# Patient Record
Sex: Male | Born: 1945 | Race: White | Hispanic: No | Smoking: Never smoker
Health system: Southern US, Community
[De-identification: ages and names within clinical notes are randomized; demographics above are authoritative.]

## PROBLEM LIST (undated history)

## (undated) DIAGNOSIS — G8929 Other chronic pain: Secondary | ICD-10-CM

## (undated) DIAGNOSIS — K219 Gastro-esophageal reflux disease without esophagitis: Secondary | ICD-10-CM

## (undated) DIAGNOSIS — M1990 Unspecified osteoarthritis, unspecified site: Secondary | ICD-10-CM

## (undated) DIAGNOSIS — R0600 Dyspnea, unspecified: Secondary | ICD-10-CM

## (undated) DIAGNOSIS — E785 Hyperlipidemia, unspecified: Secondary | ICD-10-CM

## (undated) DIAGNOSIS — M549 Dorsalgia, unspecified: Secondary | ICD-10-CM

## (undated) DIAGNOSIS — J45909 Unspecified asthma, uncomplicated: Secondary | ICD-10-CM

## (undated) DIAGNOSIS — J189 Pneumonia, unspecified organism: Secondary | ICD-10-CM

## (undated) HISTORY — PX: CHOLECYSTECTOMY: SHX55

## (undated) HISTORY — PX: APPENDECTOMY: SHX54

---

## 2013-09-24 DIAGNOSIS — G8929 Other chronic pain: Secondary | ICD-10-CM

## 2013-09-24 DIAGNOSIS — M549 Dorsalgia, unspecified: Secondary | ICD-10-CM

## 2013-09-24 HISTORY — DX: Dorsalgia, unspecified: M54.9

## 2013-09-24 HISTORY — DX: Other chronic pain: G89.29

## 2018-06-16 ENCOUNTER — Encounter (HOSPITAL_COMMUNITY): Payer: Self-pay | Admitting: *Deleted

## 2018-06-16 ENCOUNTER — Other Ambulatory Visit: Payer: Self-pay

## 2018-06-16 ENCOUNTER — Inpatient Hospital Stay (HOSPITAL_COMMUNITY)
Admission: AD | Admit: 2018-06-16 | Discharge: 2018-06-20 | DRG: 871 | Disposition: A | Discharge status: Home Health | Payer: Medicare Other | Source: Other Acute Inpatient Hospital | Attending: Family Medicine | Admitting: Family Medicine

## 2018-06-16 DIAGNOSIS — G8929 Other chronic pain: Secondary | ICD-10-CM | POA: Diagnosis present

## 2018-06-16 DIAGNOSIS — R066 Hiccough: Secondary | ICD-10-CM | POA: Diagnosis present

## 2018-06-16 DIAGNOSIS — A481 Legionnaires' disease: Secondary | ICD-10-CM | POA: Diagnosis present

## 2018-06-16 DIAGNOSIS — Z23 Encounter for immunization: Secondary | ICD-10-CM | POA: Diagnosis present

## 2018-06-16 DIAGNOSIS — Z9049 Acquired absence of other specified parts of digestive tract: Secondary | ICD-10-CM | POA: Diagnosis not present

## 2018-06-16 DIAGNOSIS — Z8249 Family history of ischemic heart disease and other diseases of the circulatory system: Secondary | ICD-10-CM | POA: Diagnosis not present

## 2018-06-16 DIAGNOSIS — E860 Dehydration: Secondary | ICD-10-CM

## 2018-06-16 DIAGNOSIS — M549 Dorsalgia, unspecified: Secondary | ICD-10-CM | POA: Diagnosis present

## 2018-06-16 DIAGNOSIS — E876 Hypokalemia: Secondary | ICD-10-CM | POA: Diagnosis present

## 2018-06-16 DIAGNOSIS — K219 Gastro-esophageal reflux disease without esophagitis: Secondary | ICD-10-CM | POA: Diagnosis present

## 2018-06-16 DIAGNOSIS — N179 Acute kidney failure, unspecified: Secondary | ICD-10-CM | POA: Diagnosis present

## 2018-06-16 DIAGNOSIS — J45909 Unspecified asthma, uncomplicated: Secondary | ICD-10-CM | POA: Diagnosis present

## 2018-06-16 DIAGNOSIS — A419 Sepsis, unspecified organism: Principal | ICD-10-CM | POA: Diagnosis present

## 2018-06-16 DIAGNOSIS — Z8701 Personal history of pneumonia (recurrent): Secondary | ICD-10-CM

## 2018-06-16 DIAGNOSIS — R197 Diarrhea, unspecified: Secondary | ICD-10-CM | POA: Diagnosis present

## 2018-06-16 DIAGNOSIS — R112 Nausea with vomiting, unspecified: Secondary | ICD-10-CM | POA: Diagnosis present

## 2018-06-16 DIAGNOSIS — Z833 Family history of diabetes mellitus: Secondary | ICD-10-CM

## 2018-06-16 DIAGNOSIS — E785 Hyperlipidemia, unspecified: Secondary | ICD-10-CM | POA: Diagnosis present

## 2018-06-16 DIAGNOSIS — Z881 Allergy status to other antibiotic agents status: Secondary | ICD-10-CM

## 2018-06-16 DIAGNOSIS — J189 Pneumonia, unspecified organism: Secondary | ICD-10-CM | POA: Diagnosis not present

## 2018-06-16 HISTORY — DX: Pneumonia, unspecified organism: J18.9

## 2018-06-16 HISTORY — DX: Hyperlipidemia, unspecified: E78.5

## 2018-06-16 HISTORY — DX: Gastro-esophageal reflux disease without esophagitis: K21.9

## 2018-06-16 HISTORY — DX: Unspecified asthma, uncomplicated: J45.909

## 2018-06-16 HISTORY — DX: Dyspnea, unspecified: R06.00

## 2018-06-16 HISTORY — DX: Dorsalgia, unspecified: M54.9

## 2018-06-16 HISTORY — DX: Other chronic pain: G89.29

## 2018-06-16 LAB — CBC
HCT: 35.5 % — ABNORMAL LOW (ref 39.0–52.0)
Hemoglobin: 11.7 g/dL — ABNORMAL LOW (ref 13.0–17.0)
MCH: 30.1 pg (ref 26.0–34.0)
MCHC: 33 g/dL (ref 30.0–36.0)
MCV: 91.3 fL (ref 78.0–100.0)
PLATELETS: 193 10*3/uL (ref 150–400)
RBC: 3.89 MIL/uL — ABNORMAL LOW (ref 4.22–5.81)
RDW: 13.6 % (ref 11.5–15.5)
WBC: 14.1 10*3/uL — ABNORMAL HIGH (ref 4.0–10.5)

## 2018-06-16 LAB — CREATININE, SERUM
CREATININE: 1.4 mg/dL — AB (ref 0.61–1.24)
GFR calc Af Amer: 56 mL/min — ABNORMAL LOW (ref >60)
GFR calc non Af Amer: 49 mL/min — ABNORMAL LOW (ref >60)

## 2018-06-16 MED ORDER — ENOXAPARIN SODIUM 40 MG/0.4ML ~~LOC~~ SOLN
40.0000 mg | SUBCUTANEOUS | Status: DC
  Administered 2018-06-16 – 2018-06-19 (×4): 40 mg via SUBCUTANEOUS
  Filled 2018-06-16 (×4): qty 0.4

## 2018-06-16 MED ORDER — LEVOFLOXACIN IN D5W 750 MG/150ML IV SOLN
750.0000 mg | INTRAVENOUS | Status: DC
  Administered 2018-06-17 – 2018-06-19 (×3): 750 mg via INTRAVENOUS
  Filled 2018-06-16 (×3): qty 150

## 2018-06-16 MED ORDER — MAGNESIUM HYDROXIDE 400 MG/5ML PO SUSP: 15.0000 mL | Freq: Every day | ORAL | Status: DC | PRN

## 2018-06-16 MED ORDER — SODIUM CHLORIDE 0.9 % IV SOLN
INTRAVENOUS | Status: AC
  Administered 2018-06-16: 19:00:00 via INTRAVENOUS

## 2018-06-16 MED ORDER — ONDANSETRON HCL 4 MG/2ML IJ SOLN
4.0000 mg | Freq: Four times a day (QID) | INTRAMUSCULAR | Status: DC | PRN
  Administered 2018-06-18: 4 mg via INTRAVENOUS
  Filled 2018-06-16: qty 2

## 2018-06-16 MED ORDER — ALUM & MAG HYDROXIDE-SIMETH 200-200-20 MG/5ML PO SUSP: 30.0000 mL | Freq: Four times a day (QID) | ORAL | Status: DC | PRN

## 2018-06-16 MED ORDER — ACETAMINOPHEN 325 MG PO TABS
650.0000 mg | ORAL_TABLET | Freq: Four times a day (QID) | ORAL | Status: DC | PRN
  Administered 2018-06-16 – 2018-06-18 (×4): 650 mg via ORAL
  Filled 2018-06-16 (×4): qty 2

## 2018-06-16 MED ORDER — CHLORPROMAZINE HCL 25 MG PO TABS
25.0000 mg | ORAL_TABLET | Freq: Once | ORAL | Status: AC
  Administered 2018-06-16: 25 mg via ORAL
  Filled 2018-06-16: qty 1

## 2018-06-16 MED ORDER — HYDROCODONE-ACETAMINOPHEN 5-325 MG PO TABS
1.0000 | ORAL_TABLET | Freq: Four times a day (QID) | ORAL | Status: DC | PRN
  Administered 2018-06-17 – 2018-06-20 (×7): 1 via ORAL
  Filled 2018-06-16 (×7): qty 1

## 2018-06-16 NOTE — Progress Notes (Signed)
Patient admitted to floor. AP swing doctor E- paged.

## 2018-06-16 NOTE — H&P (Signed)
History and Physical    [name] ION:[id number] DOB: 1946-06-02 DOA: 06/16/2018  PCP: Donalynn Furlong, MD   Patient coming from: [facility]   I have personally briefly reviewed patient's old medical records in [place]  Chief Complaint: nausea, vomiting  HPI: Evan Price is a 72 y.o. male with medical history significant of asthma, chronic back pain, hyperlipidemia presents with nausea and vomiting.  Patient went to urgent care with a complaint of nausea vomiting and myalgias for 4 days.  Patient was then sent over to the ED at [facility].  Chest x-ray was done which showed a left-sided infiltrate.  Patient was febrile upon presentation.  He also has significant leukocytosis of 16,000.  Flu was negative.  Patient had no recent hospitalizations.  He did have pneumonia a year ago.  Denies any obvious sick contacts.  Has had very little p.o. intake for the past 4 days.  He also had one loose stool a day.  He denies any recent antibiotic usage.  Patient denies any history of kidney disease.  Patient found to have a creatinine of 1.5 with BUN 23 with no prior for comparison.  She does have a history of asthma for which he uses as needed Ventolin.  ED Course: She was given 1 L IV fluid and IV Levaquin, DuoNeb's, antiemetics with some improvement of his symptoms.  Review of Systems: Positive for hiccups, no cough, no chest pain All others reviewed with patient  and are  negative unless otherwise stated   Past Medical History:  Diagnosis Date  . Asthma   . Chronic back pain 2015  . Dyslipidemia   . Dyspnea   . GERD (gastroesophageal reflux disease)   . Pneumonia     Past Surgical History:  Procedure Laterality Date  . APPENDECTOMY       reports that he has never smoked. He has never used smokeless tobacco. He reports that he drinks about 3.0 standard drinks of alcohol per week. He reports that he does not use drugs.  Allergies  Allergen Reactions  . Keflex  [Cephalexin]     Family History  Problem Relation Age of Onset  . Diabetes Mother   . Hypertension Father      Prior to Admission medications   Not on File    Physical Exam: Vitals:   06/16/18 1703  BP: (!) 145/78  Pulse: (!) 116  Resp: 18  Temp: (!) 102.4 F (39.1 C)  TempSrc: Oral  SpO2: 100%  Weight: 75.3 kg  Height: 5\' 11"  (1.803 m)    Constitutional: NAD, calm,illl appearing, warm to touch  Vitals:   06/16/18 1703  BP: (!) 145/78  Pulse: (!) 116  Resp: 18  Temp: (!) 102.4 F (39.1 C)  TempSrc: Oral  SpO2: 100%  Weight: 75.3 kg  Height: 5\' 11"  (1.803 m)   Eyes: PERRL, lids and conjunctivae normal ENMT: Mucous membranes are moist. Posterior pharynx clear of any exudate or lesions.poor dentition.  Neck: normal, supple, no masses,  Respiratory: Fair air movement throughout , no wheezing, no crackles.  Cardiovascular: Tachycardic rate and regular rhythm, no murmurs / rubs / gallops. No extremity edema. 2+ pedal pulses. .  Abdomen: no tenderness, no masses palpated. No hepatosplenomegaly. Bowel sounds positive.  Musculoskeletal: no clubbing / cyanosis. No joint deformity upper and lower extremities.  no contractures. Normal muscle tone.  Skin: no rashes, lesions, ulcers. No induration Neurologic: CN 2-12 grossly intact.  Strength 5/5 in all 4.  Psychiatric:  Normal judgment and insight. Alert and oriented x 3. Normal mood.   Labs on Admission: I have personally reviewed following labs and imaging studies White count 16.9 thousand hemoglobin 14.2 creatinine 1.5 BUN 23 from outside facility  CBC: No results for input(s): WBC, NEUTROABS, HGB, HCT, MCV, PLT in the last 168 hours. Basic Metabolic Panel: No results for input(s): NA, K, CL, CO2, GLUCOSE, BUN, CREATININE, CALCIUM, MG, PHOS in the last 168 hours. GFR: CrCl cannot be calculated (No successful lab value found.). Liver Function Tests: No results for input(s): AST, ALT, ALKPHOS, BILITOT, PROT, ALBUMIN  in the last 168 hours. No results for input(s): LIPASE, AMYLASE in the last 168 hours. No results for input(s): AMMONIA in the last 168 hours. Coagulation Profile: No results for input(s): INR, PROTIME in the last 168 hours. Cardiac Enzymes: No results for input(s): CKTOTAL, CKMB, CKMBINDEX, TROPONINI in the last 168 hours. BNP (last 3 results) No results for input(s): PROBNP in the last 8760 hours. HbA1C: No results for input(s): HGBA1C in the last 72 hours. CBG: No results for input(s): GLUCAP in the last 168 hours. Lipid Profile: No results for input(s): CHOL, HDL, LDLCALC, TRIG, CHOLHDL, LDLDIRECT in the last 72 hours. Thyroid Function Tests: No results for input(s): TSH, T4TOTAL, FREET4, T3FREE, THYROIDAB in the last 72 hours. Anemia Panel: No results for input(s): VITAMINB12, FOLATE, FERRITIN, TIBC, IRON, RETICCTPCT in the last 72 hours. Urine analysis: No results found for: COLORURINE, APPEARANCEUR, LABSPEC, PHURINE, GLUCOSEU, HGBUR, BILIRUBINUR, KETONESUR, PROTEINUR, UROBILINOGEN, NITRITE, LEUKOCYTESUR  Radiological Exams on Admission: No results found.    Assessment/Plan Principal Problem:   PNA (pneumonia) Active Problems:   Dehydration   AKI (acute kidney injury) ([place])   Sepsis (HCC)   Chronic back pain   GERD (gastroesophageal reflux disease)   -Antibiotics per protocol, supportive care, pulmonary toilet.  Follow cultures.  Supplemental oxygen as needed, full liquid diet advance as tolerated, send Legionella and pneumococcal antigens -Continue IV hydration, avoid nephrotoxins ,repeat labs in the a.m. Check CK  -Suspect early sepsis + tachycardia and leukocytosis. BP WNL . -Continue home meds for chronic back pain and GERD   DVT prophylaxis: Lovenox Code Status: Full  disposition Plan: Home 2 days Admission status: Inpatient telemetry It is my clinical opinion that admission to INPATIENT is reasonable and necessary because of the expectation that this  patient will require hospital care that crosses at least 2 midnights to treat this condition based on the medical complexity of the problems presented.    Shelbie Proctor MD [facility] Pager [phone]  If 7PM-7AM, please contact night-coverage [email or web address] Password [email or web address]  06/16/2018, 6:19 PM

## 2018-06-17 LAB — CBC WITH DIFFERENTIAL/PLATELET
Basophils Absolute: 0 10*3/uL (ref 0.0–0.1)
Basophils Relative: 0 %
Eosinophils Absolute: 0 10*3/uL (ref 0.0–0.7)
Eosinophils Relative: 0 %
HEMATOCRIT: 35.4 % — AB (ref 39.0–52.0)
Hemoglobin: 11.6 g/dL — ABNORMAL LOW (ref 13.0–17.0)
LYMPHS ABS: 0.5 10*3/uL — AB (ref 0.7–4.0)
LYMPHS PCT: 5 %
MCH: 29.9 pg (ref 26.0–34.0)
MCHC: 32.8 g/dL (ref 30.0–36.0)
MCV: 91.2 fL (ref 78.0–100.0)
MONO ABS: 0.8 10*3/uL (ref 0.1–1.0)
Monocytes Relative: 8 %
NEUTROS ABS: 9 10*3/uL — AB (ref 1.7–7.7)
Neutrophils Relative %: 87 %
Platelets: 189 10*3/uL (ref 150–400)
RBC: 3.88 MIL/uL — ABNORMAL LOW (ref 4.22–5.81)
RDW: 13.7 % (ref 11.5–15.5)
WBC: 10.3 10*3/uL (ref 4.0–10.5)

## 2018-06-17 LAB — COMPREHENSIVE METABOLIC PANEL
ALBUMIN: 2.6 g/dL — AB (ref 3.5–5.0)
ALK PHOS: 73 U/L (ref 38–126)
ALT: 11 U/L (ref 0–44)
ANION GAP: 10 (ref 5–15)
AST: 20 U/L (ref 15–41)
BUN: 16 mg/dL (ref 8–23)
CALCIUM: 8 mg/dL — AB (ref 8.9–10.3)
CHLORIDE: 103 mmol/L (ref 98–111)
CO2: 22 mmol/L (ref 22–32)
Creatinine, Ser: 1.34 mg/dL — ABNORMAL HIGH (ref 0.61–1.24)
GFR calc Af Amer: 59 mL/min — ABNORMAL LOW (ref >60)
GFR calc non Af Amer: 51 mL/min — ABNORMAL LOW (ref >60)
GLUCOSE: 102 mg/dL — AB (ref 70–99)
POTASSIUM: 3.9 mmol/L (ref 3.5–5.1)
SODIUM: 135 mmol/L (ref 135–145)
Total Bilirubin: 0.9 mg/dL (ref 0.3–1.2)
Total Protein: 6.3 g/dL — ABNORMAL LOW (ref 6.5–8.1)

## 2018-06-17 LAB — LACTIC ACID, PLASMA
LACTIC ACID, VENOUS: 2.1 mmol/L — AB (ref 0.5–1.9)
Lactic Acid, Venous: 1.2 mmol/L (ref 0.5–1.9)

## 2018-06-17 LAB — CK: CK TOTAL: 174 U/L (ref 49–397)

## 2018-06-17 MED ORDER — METOPROLOL TARTRATE 25 MG PO TABS
12.5000 mg | ORAL_TABLET | Freq: Two times a day (BID) | ORAL | Status: DC
  Administered 2018-06-17 – 2018-06-20 (×6): 12.5 mg via ORAL
  Filled 2018-06-17 (×6): qty 1

## 2018-06-17 MED ORDER — LACTATED RINGERS IV BOLUS
1000.0000 mL | Freq: Once | INTRAVENOUS | Status: AC
  Administered 2018-06-17: 1000 mL via INTRAVENOUS

## 2018-06-17 MED ORDER — IPRATROPIUM-ALBUTEROL 0.5-2.5 (3) MG/3ML IN SOLN
3.0000 mL | Freq: Four times a day (QID) | RESPIRATORY_TRACT | Status: DC | PRN
  Administered 2018-06-17 – 2018-06-19 (×3): 3 mL via RESPIRATORY_TRACT
  Filled 2018-06-17 (×3): qty 3

## 2018-06-17 MED ORDER — BACLOFEN 10 MG PO TABS
5.0000 mg | ORAL_TABLET | Freq: Three times a day (TID) | ORAL | Status: AC
  Administered 2018-06-17 (×3): 5 mg via ORAL
  Filled 2018-06-17 (×3): qty 1

## 2018-06-17 MED ORDER — IPRATROPIUM-ALBUTEROL 0.5-2.5 (3) MG/3ML IN SOLN
3.0000 mL | Freq: Four times a day (QID) | RESPIRATORY_TRACT | Status: DC | PRN
  Administered 2018-06-17: 3 mL via RESPIRATORY_TRACT
  Filled 2018-06-17: qty 3

## 2018-06-17 MED ORDER — GABAPENTIN 400 MG PO CAPS
400.0000 mg | ORAL_CAPSULE | Freq: Three times a day (TID) | ORAL | Status: DC
  Administered 2018-06-17 – 2018-06-20 (×11): 400 mg via ORAL
  Filled 2018-06-17 (×11): qty 1

## 2018-06-17 MED ORDER — METOPROLOL TARTRATE 5 MG/5ML IV SOLN
2.5000 mg | Freq: Once | INTRAVENOUS | Status: AC
  Administered 2018-06-17: 2.5 mg via INTRAVENOUS
  Filled 2018-06-17: qty 5

## 2018-06-17 MED ORDER — SODIUM CHLORIDE 0.9 % IV SOLN
INTRAVENOUS | Status: AC
  Administered 2018-06-17 (×2): via INTRAVENOUS

## 2018-06-17 MED ORDER — HYDROCODONE-ACETAMINOPHEN 5-325 MG PO TABS: 1.0000 | ORAL_TABLET | Freq: Four times a day (QID) | ORAL | Status: DC | PRN

## 2018-06-17 NOTE — Progress Notes (Signed)
PROGRESS NOTE    Evan Price  DVV:[id number] DOB: 1945/11/14 DOA: 06/16/2018 PCP: Donalynn Furlong, MD   Brief Narrative:   Evan Price is a 72 y.o. male with medical history significant of asthma, chronic back pain, hyperlipidemia presents with nausea and vomiting.  Patient went to urgent care with a complaint of nausea vomiting and myalgias for 4 days.  Patient was then sent over to the ED at [facility].  Chest x-ray was done which showed a left-sided infiltrate.  Patient was febrile upon presentation.  He also has significant leukocytosis of 16,000.  Flu was negative.  He was noted to have creatinine of 1.5 with BUN 23 with no prior for comparison.  He was admitted with sepsis secondary to pneumonia along with suspected AKI.  He has been started on Levaquin as well as IV hydration.  Assessment & Plan:   Principal Problem:   PNA (pneumonia) Active Problems:   Dehydration   AKI (acute kidney injury) ([place])   Sepsis (HCC)   Chronic back pain   GERD (gastroesophageal reflux disease)   1. Sepsis secondary to community-acquired pneumonia.  Legionella and pneumococcal antigens pending.  Continue on IV Levaquin.  Lactic acid of 1.2 noted.  Continue to follow cultures and he is aggressive IV fluid hydration. 2. AKI versus CKD.  Continue IV fluid hydration and avoid nephrotoxic agents. 3. Chronic back pain.  Continue home medications as needed. 4. GERD.  PPI.   DVT prophylaxis:Lovenox Code Status: Full Family Communication: None at bedside Disposition Plan: Treatment with IV antibiotics and IV fluids with possible discharge in next 24 to 48 hours   Consultants:   None  Procedures:   None  Antimicrobials:   Levaquin 9/23->   Subjective: Patient seen and evaluated today with no new acute complaints or concerns. No acute concerns or events noted overnight.  He continues to have some hiccups and is noted to have tachycardia on telemetry.  Objective: Vitals:   06/17/18 0429 06/17/18 0534 06/17/18 0935 06/17/18 0936  BP:  118/69 127/64   Pulse:  (!) 121 (!) 117 (!) 125  Resp:   20   Temp:  99.1 F (37.3 C) 99.1 F (37.3 C)   TempSrc:  Oral Oral   SpO2: 93% 99% 93% 99%  Weight:      Height:        Intake/Output Summary (Last 24 hours) at 06/17/2018 1139 Last data filed at 06/17/2018 1007 Gross per 24 hour  Intake 839.12 ml  Output -  Net 839.12 ml   Filed Weights   06/16/18 1703  Weight: 75.3 kg    Examination:  General exam: Appears calm and comfortable  Respiratory system: Clear to auscultation. Respiratory effort normal. Cardiovascular system: S1 & S2 heard, RRR. No JVD, murmurs, rubs, gallops or clicks. No pedal edema. Gastrointestinal system: Abdomen is nondistended, soft and nontender. No organomegaly or masses felt. Normal bowel sounds heard. Central nervous system: Alert and oriented. No focal neurological deficits. Extremities: Symmetric 5 x 5 power. Skin: No rashes, lesions or ulcers Psychiatry: Judgement and insight appear normal. Mood & affect appropriate.     Data Reviewed: I have personally reviewed following labs and imaging studies  CBC: Recent Labs  Lab 06/16/18 1923 06/17/18 0437  WBC 14.1* 10.3  NEUTROABS  --  9.0*  HGB 11.7* 11.6*  HCT 35.5* 35.4*  MCV 91.3 91.2  PLT 193 626   Basic Metabolic Panel: Recent Labs  Lab 06/16/18 1923 06/17/18 0437  NA  --  135  K  --  3.9  CL  --  103  CO2  --  22  GLUCOSE  --  102*  BUN  --  16  CREATININE 1.40* 1.34*  CALCIUM  --  8.0*   GFR: Estimated Creatinine Clearance: 53.1 mL/min (A) (by C-G formula based on SCr of 1.34 mg/dL (H)). Liver Function Tests: Recent Labs  Lab 06/17/18 0437  AST 20  ALT 11  ALKPHOS 73  BILITOT 0.9  PROT 6.3*  ALBUMIN 2.6*   No results for input(s): LIPASE, AMYLASE in the last 168 hours. No results for input(s): AMMONIA in the last 168 hours. Coagulation Profile: No results for input(s): INR, PROTIME in the last  168 hours. Cardiac Enzymes: Recent Labs  Lab 06/17/18 0437  CKTOTAL 174   BNP (last 3 results) No results for input(s): PROBNP in the last 8760 hours. HbA1C: No results for input(s): HGBA1C in the last 72 hours. CBG: No results for input(s): GLUCAP in the last 168 hours. Lipid Profile: No results for input(s): CHOL, HDL, LDLCALC, TRIG, CHOLHDL, LDLDIRECT in the last 72 hours. Thyroid Function Tests: No results for input(s): TSH, T4TOTAL, FREET4, T3FREE, THYROIDAB in the last 72 hours. Anemia Panel: No results for input(s): VITAMINB12, FOLATE, FERRITIN, TIBC, IRON, RETICCTPCT in the last 72 hours. Sepsis Labs: Recent Labs  Lab 06/17/18 1032  LATICACIDVEN 1.2    No results found for this or any previous visit (from the past 240 hour(s)).       Radiology Studies: No results found.      Scheduled Meds: . enoxaparin (LOVENOX) injection  40 mg Subcutaneous Q24H   Continuous Infusions: . levofloxacin (LEVAQUIN) IV       LOS: 1 day    Time spent: 30 minutes    Duval Macleod Darleen Crocker, DO [facility] Pager [phone]  If 7PM-7AM, please contact night-coverage [email or web address] Password [facility] 06/17/2018, 11:39 AM

## 2018-06-17 NOTE — Progress Notes (Signed)
Central Telemetry informed this nurse patient having trigemity PVCs, MD notified.

## 2018-06-17 NOTE — Progress Notes (Signed)
Late entry: Patient heart rate in 130's MD notified. Ordered lactated Ringer bolus. Patient heart rate is 91 bpm at this time.

## 2018-06-17 NOTE — Progress Notes (Signed)
CRITICAL VALUE ALERT  Critical Value:  Lactic Acid 2.1  Date & Time Notied:  06/17/18 1425  Provider Notified: Manuella Ghazi  Orders Received/Actions taken:

## 2018-06-18 LAB — BASIC METABOLIC PANEL
ANION GAP: 7 (ref 5–15)
BUN: 12 mg/dL (ref 8–23)
CHLORIDE: 102 mmol/L (ref 98–111)
CO2: 24 mmol/L (ref 22–32)
Calcium: 8.1 mg/dL — ABNORMAL LOW (ref 8.9–10.3)
Creatinine, Ser: 1.05 mg/dL (ref 0.61–1.24)
GFR calc Af Amer: >60 mL/min (ref >60)
GFR calc non Af Amer: >60 mL/min (ref >60)
Glucose, Bld: 102 mg/dL — ABNORMAL HIGH (ref 70–99)
POTASSIUM: 3.6 mmol/L (ref 3.5–5.1)
Sodium: 133 mmol/L — ABNORMAL LOW (ref 135–145)

## 2018-06-18 LAB — CBC
HEMATOCRIT: 34 % — AB (ref 39.0–52.0)
HEMOGLOBIN: 11.4 g/dL — AB (ref 13.0–17.0)
MCH: 30.1 pg (ref 26.0–34.0)
MCHC: 33.5 g/dL (ref 30.0–36.0)
MCV: 89.7 fL (ref 78.0–100.0)
Platelets: 219 10*3/uL (ref 150–400)
RBC: 3.79 MIL/uL — AB (ref 4.22–5.81)
RDW: 13.7 % (ref 11.5–15.5)
WBC: 7.2 10*3/uL (ref 4.0–10.5)

## 2018-06-18 LAB — STREP PNEUMONIAE URINARY ANTIGEN: Strep Pneumo Urinary Antigen: NEGATIVE

## 2018-06-18 LAB — LACTIC ACID, PLASMA: Lactic Acid, Venous: 1.1 mmol/L (ref 0.5–1.9)

## 2018-06-18 MED ORDER — CHLORPROMAZINE HCL 25 MG/ML IJ SOLN
25.0000 mg | Freq: Once | INTRAMUSCULAR | Status: AC
  Administered 2018-06-18: 25 mg via INTRAMUSCULAR
  Filled 2018-06-18: qty 1

## 2018-06-18 MED ORDER — SODIUM CHLORIDE 0.9 % IV SOLN
INTRAVENOUS | Status: AC
  Administered 2018-06-18: 13:00:00 via INTRAVENOUS

## 2018-06-18 MED ORDER — LOPERAMIDE HCL 2 MG PO CAPS
2.0000 mg | ORAL_CAPSULE | ORAL | Status: DC | PRN
  Administered 2018-06-18 – 2018-06-19 (×2): 2 mg via ORAL
  Filled 2018-06-18 (×2): qty 1

## 2018-06-18 NOTE — Progress Notes (Signed)
PROGRESS NOTE    Evan Price  NFA:[id number] DOB: [birth year] DOA: 06/16/2018 PCP: Donalynn Furlong, MD   Brief Narrative:   [name] a 72 y.o.malewith medical history significant ofasthma, chronic back pain, hyperlipidemia presents with nausea and vomiting. Patient went to urgent care with a complaint of nausea vomiting and myalgias for 4 days. Patient was then sent over to the ED at [facility]. Chest x-ray was done which showed a left-sided infiltrate. Patient was febrile upon presentation. He also has significant leukocytosis of 16,000. Flu was negative.  He was noted to have creatinine of 1.5 with BUN 23 with no prior for comparison.  He was admitted with sepsis secondary to pneumonia along with suspected AKI.  He has been started on Levaquin as well as IV hydration.  Assessment & Plan:   Principal Problem:   PNA (pneumonia) Active Problems:   Dehydration   AKI (acute kidney injury) ([place])   Sepsis (HCC)   Chronic back pain   GERD (gastroesophageal reflux disease)   1. Sepsis secondary to community-acquired pneumonia; slowly improving.  Legionella and pneumococcal antigens pending since urine sample was not obtained.  Continue on IV Levaquin.  Lactic acid of 1.2 noted.  Continue to follow cultures and he is aggressive IV fluid hydration.  Patient continues to have symptoms of nausea and vomiting as well as hiccups.  We will try Thorazine IM to assist with symptoms and advance diet as tolerated. 2. AKI versus CKD-improving.  Continue IV fluid hydration and avoid nephrotoxic agents. 3. Chronic back pain.  Continue home medications as needed. 4. GERD.  PPI.   DVT prophylaxis:Lovenox Code Status: Full Family Communication: Son at bedside Disposition Plan: Treatment with IV antibiotics and IV fluids with possible discharge in next 24 to 48 hours; he continues to have poor oral intake with ongoing nausea and vomiting related to pneumonia along with  hiccups.  We will not tolerate home oral medications at this time.   Consultants:   None  Procedures:   None  Antimicrobials:   Levaquin 9/23->  Subjective: Patient seen and evaluated today with no new acute complaints or concerns. No acute concerns or events noted overnight.  He continues to have hiccups and remains nauseous with poor oral intake.  Objective: Vitals:   06/17/18 2106 06/17/18 2113 06/17/18 2255 06/18/18 0610  BP:  121/77  (!) 111/48  Pulse:  (!) 105  (!) 101  Resp:      Temp:  98.5 F (36.9 C)  99.4 F (37.4 C)  TempSrc:  Oral  Oral  SpO2: 96% 96% 91% 97%  Weight:      Height:        Intake/Output Summary (Last 24 hours) at 06/18/2018 1304 Last data filed at 06/17/2018 1700 Gross per 24 hour  Intake 745.55 ml  Output -  Net 745.55 ml   Filed Weights   06/16/18 1703  Weight: 75.3 kg    Examination:  General exam: Appears calm and comfortable  Respiratory system: Clear to auscultation. Respiratory effort normal. Cardiovascular system: S1 & S2 heard, RRR. No JVD, murmurs, rubs, gallops or clicks. No pedal edema. Gastrointestinal system: Abdomen is nondistended, soft and nontender. No organomegaly or masses felt. Normal bowel sounds heard. Central nervous system: Alert and oriented. No focal neurological deficits. Extremities: Symmetric 5 x 5 power. Skin: No rashes, lesions or ulcers Psychiatry: Judgement and insight appear normal. Mood & affect appropriate.     Data Reviewed: I have personally reviewed following labs and imaging  studies  CBC: Recent Labs  Lab 06/16/18 1923 06/17/18 0437 06/18/18 0523  WBC 14.1* 10.3 7.2  NEUTROABS  --  9.0*  --   HGB 11.7* 11.6* 11.4*  HCT 35.5* 35.4* 34.0*  MCV 91.3 91.2 89.7  PLT 193 189 732   Basic Metabolic Panel: Recent Labs  Lab 06/16/18 1923 06/17/18 0437 06/18/18 0523  NA  --  135 133*  K  --  3.9 3.6  CL  --  103 102  CO2  --  22 24  GLUCOSE  --  102* 102*  BUN  --  16 12    CREATININE 1.40* 1.34* 1.05  CALCIUM  --  8.0* 8.1*   GFR: Estimated Creatinine Clearance: 67.7 mL/min (by C-G formula based on SCr of 1.05 mg/dL). Liver Function Tests: Recent Labs  Lab 06/17/18 0437  AST 20  ALT 11  ALKPHOS 73  BILITOT 0.9  PROT 6.3*  ALBUMIN 2.6*   No results for input(s): LIPASE, AMYLASE in the last 168 hours. No results for input(s): AMMONIA in the last 168 hours. Coagulation Profile: No results for input(s): INR, PROTIME in the last 168 hours. Cardiac Enzymes: Recent Labs  Lab 06/17/18 0437  CKTOTAL 174   BNP (last 3 results) No results for input(s): PROBNP in the last 8760 hours. HbA1C: No results for input(s): HGBA1C in the last 72 hours. CBG: No results for input(s): GLUCAP in the last 168 hours. Lipid Profile: No results for input(s): CHOL, HDL, LDLCALC, TRIG, CHOLHDL, LDLDIRECT in the last 72 hours. Thyroid Function Tests: No results for input(s): TSH, T4TOTAL, FREET4, T3FREE, THYROIDAB in the last 72 hours. Anemia Panel: No results for input(s): VITAMINB12, FOLATE, FERRITIN, TIBC, IRON, RETICCTPCT in the last 72 hours. Sepsis Labs: Recent Labs  Lab 06/17/18 1032 06/17/18 1333 06/18/18 0523  LATICACIDVEN 1.2 2.1* 1.1    No results found for this or any previous visit (from the past 240 hour(s)).       Radiology Studies: No results found.      Scheduled Meds: . chlorproMAZINE (THORAZINE) injection  25 mg Intramuscular Once  . enoxaparin (LOVENOX) injection  40 mg Subcutaneous Q24H  . gabapentin  400 mg Oral TID  . metoprolol tartrate  12.5 mg Oral BID   Continuous Infusions: . sodium chloride    . levofloxacin (LEVAQUIN) IV Stopped (06/17/18 1336)     LOS: 2 days    Time spent: 30 minutes    Anaiah Mcmannis Darleen Crocker, DO [facility] Pager [phone]  If 7PM-7AM, please contact night-coverage [email or web address] Password [email or web address] 06/18/2018, 1:04 PM

## 2018-06-19 LAB — CBC
HCT: 33.8 % — ABNORMAL LOW (ref 39.0–52.0)
HEMOGLOBIN: 11.1 g/dL — AB (ref 13.0–17.0)
MCH: 29.4 pg (ref 26.0–34.0)
MCHC: 32.8 g/dL (ref 30.0–36.0)
MCV: 89.7 fL (ref 78.0–100.0)
Platelets: 267 10*3/uL (ref 150–400)
RBC: 3.77 MIL/uL — AB (ref 4.22–5.81)
RDW: 13.9 % (ref 11.5–15.5)
WBC: 6 10*3/uL (ref 4.0–10.5)

## 2018-06-19 LAB — BASIC METABOLIC PANEL
ANION GAP: 10 (ref 5–15)
BUN: 12 mg/dL (ref 8–23)
CHLORIDE: 98 mmol/L (ref 98–111)
CO2: 24 mmol/L (ref 22–32)
Calcium: 8.2 mg/dL — ABNORMAL LOW (ref 8.9–10.3)
Creatinine, Ser: 1.01 mg/dL (ref 0.61–1.24)
GFR calc non Af Amer: >60 mL/min (ref >60)
GLUCOSE: 95 mg/dL (ref 70–99)
POTASSIUM: 3.4 mmol/L — AB (ref 3.5–5.1)
Sodium: 132 mmol/L — ABNORMAL LOW (ref 135–145)

## 2018-06-19 MED ORDER — CHLORPROMAZINE HCL 25 MG PO TABS
25.0000 mg | ORAL_TABLET | Freq: Three times a day (TID) | ORAL | Status: DC
  Administered 2018-06-19 – 2018-06-20 (×4): 25 mg via ORAL
  Filled 2018-06-19 (×13): qty 1

## 2018-06-19 MED ORDER — RISAQUAD PO CAPS
1.0000 | ORAL_CAPSULE | Freq: Every day | ORAL | Status: DC
  Administered 2018-06-19 – 2018-06-20 (×2): 1 via ORAL
  Filled 2018-06-19 (×2): qty 1

## 2018-06-19 MED ORDER — POTASSIUM CHLORIDE CRYS ER 20 MEQ PO TBCR
40.0000 meq | EXTENDED_RELEASE_TABLET | Freq: Once | ORAL | Status: AC
  Administered 2018-06-19: 40 meq via ORAL
  Filled 2018-06-19: qty 2

## 2018-06-19 MED ORDER — INFLUENZA VAC SPLIT HIGH-DOSE 0.5 ML IM SUSY
0.5000 mL | PREFILLED_SYRINGE | INTRAMUSCULAR | Status: AC
  Administered 2018-06-20: 0.5 mL via INTRAMUSCULAR
  Filled 2018-06-19: qty 0.5

## 2018-06-19 MED ORDER — SODIUM CHLORIDE 0.9 % IV SOLN
INTRAVENOUS | Status: AC
  Administered 2018-06-19: 11:00:00 via INTRAVENOUS

## 2018-06-19 NOTE — Progress Notes (Signed)
Contacted Dr. Manuella Ghazi concerning continuing hiccups.  Oral thorazine given

## 2018-06-19 NOTE — Progress Notes (Signed)
PROGRESS NOTE    Evan Price  MCN:[id number] DOB: [birth year] DOA: 06/16/2018 PCP: Donalynn Furlong, MD   Brief Narrative:   Evan Price a 72 y.o.malewith medical history significant ofasthma, chronic back pain, hyperlipidemia presents with nausea and vomiting. Patient went to urgent care with a complaint of nausea vomiting and myalgias for 4 days. Patient was then sent over to the ED at [facility]. Chest x-ray was done which showed a left-sided infiltrate. Patient was febrile upon presentation. He also has significant leukocytosis of 16,000. Flu was negative.He was noted to have creatinine of 1.5 with BUN 23 with no prior for comparison that is now improving. He was admitted with sepsis secondary to pneumonia along with suspected AKI. He has been started on Levaquin as well as IV hydration.  He has had some mild diarrhea and is currently on Imodium and has some mild hypokalemia as a result which is being repleted.  His diet is being advanced to soft today.  He continues to have intractable hiccups and has received IM Thorazine with oral Thorazine ordered today.  Assessment & Plan:  Principal Problem: PNA (pneumonia) Active Problems: Dehydration AKI (acute kidney injury) ([place]) Sepsis (HCC) Chronic back pain GERD (gastroesophageal reflux disease)   1. Sepsis secondary to community-acquired pneumonia; slowly improving. Legionella antigen pending and pneumococcal negative. Continue on IV Levaquin day 4. Lactic acid of 1.1 noted. Continue to follow cultures and he is aggressive IV fluid hydration.  Patient continues to have symptoms of nausea and vomiting as well as hiccups.  We will try Thorazine PO to assist with symptoms and advance diet to soft today. 2. AKI versus CKD-improving. Continue IV fluid hydration and avoid nephrotoxic agents. 3. Mild diarrhea with associated hypokalemia.  Replete with oral potassium and recheck labs in a.m. along  with magnesium and maintain on Imodium.  Will also start probiotics.  Could consider checking GI panel or C. difficile should this continue to persist or worsen.  No abdominal tenderness or significant leukocytosis noted. 4. Chronic back pain. Continue home medications as needed. 5. GERD. PPI.   DVT prophylaxis:Lovenox Code Status:Full Family Communication:Son at bedside with discussion on 9/25 Disposition Plan:Continue treatment with IV antibiotics and IV fluids with possible discharge in the next 24 to 48 hours after he is having reliable oral intake and symptoms are improved including diarrhea.  Urine Legionella antigen still pending with strep pneumonia negative.  He continues to have ongoing diarrhea and weakness and requires IV fluid and potassium supplementation.   Consultants:  None  Procedures:  None  Antimicrobials:  Levaquin 9/23->  Subjective: Patient seen and evaluated today with no new acute complaints or concerns.  He has had some improvement in his nausea and vomiting but continues to have intractable hiccups.  He continues to maintain some diarrhea as well and is generally feeling weak.  Objective: Vitals:   06/18/18 2050 06/18/18 2236 06/18/18 2313 06/19/18 0553  BP:   128/65 (!) 146/75  Pulse:   97 96  Resp:   20 20  Temp:   98.9 F (37.2 C) 98.7 F (37.1 C)  TempSrc:   Oral Oral  SpO2: 93% 94% 100% 96%  Weight:      Height:        Intake/Output Summary (Last 24 hours) at 06/19/2018 1030 Last data filed at 06/19/2018 0950 Gross per 24 hour  Intake 360 ml  Output -  Net 360 ml   Filed Weights   06/16/18 1703  Weight: 75.3 kg  Examination:  General exam: Appears calm and comfortable  Respiratory system: Clear to auscultation. Respiratory effort normal. Cardiovascular system: S1 & S2 heard, RRR. No JVD, murmurs, rubs, gallops or clicks. No pedal edema. Gastrointestinal system: Abdomen is nondistended, soft and nontender. No  organomegaly or masses felt. Normal bowel sounds heard. Central nervous system: Alert and oriented. No focal neurological deficits. Extremities: Symmetric 5 x 5 power. Skin: No rashes, lesions or ulcers Psychiatry: Judgement and insight appear normal. Mood & affect appropriate.     Data Reviewed: I have personally reviewed following labs and imaging studies  CBC: Recent Labs  Lab 06/16/18 1923 06/17/18 0437 06/18/18 0523 06/19/18 0441  WBC 14.1* 10.3 7.2 6.0  NEUTROABS  --  9.0*  --   --   HGB 11.7* 11.6* 11.4* 11.1*  HCT 35.5* 35.4* 34.0* 33.8*  MCV 91.3 91.2 89.7 89.7  PLT 193 189 219 505   Basic Metabolic Panel: Recent Labs  Lab 06/16/18 1923 06/17/18 0437 06/18/18 0523 06/19/18 0441  NA  --  135 133* 132*  K  --  3.9 3.6 3.4*  CL  --  103 102 98  CO2  --  22 24 24   GLUCOSE  --  102* 102* 95  BUN  --  16 12 12   CREATININE 1.40* 1.34* 1.05 1.01  CALCIUM  --  8.0* 8.1* 8.2*   GFR: Estimated Creatinine Clearance: 70.4 mL/min (by C-G formula based on SCr of 1.01 mg/dL). Liver Function Tests: Recent Labs  Lab 06/17/18 0437  AST 20  ALT 11  ALKPHOS 73  BILITOT 0.9  PROT 6.3*  ALBUMIN 2.6*   No results for input(s): LIPASE, AMYLASE in the last 168 hours. No results for input(s): AMMONIA in the last 168 hours. Coagulation Profile: No results for input(s): INR, PROTIME in the last 168 hours. Cardiac Enzymes: Recent Labs  Lab 06/17/18 0437  CKTOTAL 174   BNP (last 3 results) No results for input(s): PROBNP in the last 8760 hours. HbA1C: No results for input(s): HGBA1C in the last 72 hours. CBG: No results for input(s): GLUCAP in the last 168 hours. Lipid Profile: No results for input(s): CHOL, HDL, LDLCALC, TRIG, CHOLHDL, LDLDIRECT in the last 72 hours. Thyroid Function Tests: No results for input(s): TSH, T4TOTAL, FREET4, T3FREE, THYROIDAB in the last 72 hours. Anemia Panel: No results for input(s): VITAMINB12, FOLATE, FERRITIN, TIBC, IRON,  RETICCTPCT in the last 72 hours. Sepsis Labs: Recent Labs  Lab 06/17/18 1032 06/17/18 1333 06/18/18 0523  LATICACIDVEN 1.2 2.1* 1.1    No results found for this or any previous visit (from the past 240 hour(s)).       Radiology Studies: No results found.      Scheduled Meds: . acidophilus  1 capsule Oral Daily  . chlorproMAZINE  25 mg Oral TID  . enoxaparin (LOVENOX) injection  40 mg Subcutaneous Q24H  . gabapentin  400 mg Oral TID  . metoprolol tartrate  12.5 mg Oral BID  . potassium chloride  40 mEq Oral Once   Continuous Infusions: . sodium chloride    . levofloxacin (LEVAQUIN) IV 750 mg (06/18/18 1309)     LOS: 3 days    Time spent: 30 minutes    Randle Shatzer Darleen Crocker, DO [facility] Pager [phone]  If 7PM-7AM, please contact night-coverage [email or web address] Password [email or web address] 06/19/2018, 10:30 AM

## 2018-06-20 DIAGNOSIS — A481 Legionnaires' disease: Secondary | ICD-10-CM | POA: Diagnosis present

## 2018-06-20 LAB — CBC
HEMATOCRIT: 32.8 % — AB (ref 39.0–52.0)
HEMOGLOBIN: 10.8 g/dL — AB (ref 13.0–17.0)
MCH: 29.4 pg (ref 26.0–34.0)
MCHC: 32.9 g/dL (ref 30.0–36.0)
MCV: 89.4 fL (ref 78.0–100.0)
PLATELETS: 286 10*3/uL (ref 150–400)
RBC: 3.67 MIL/uL — AB (ref 4.22–5.81)
RDW: 14 % (ref 11.5–15.5)
WBC: 4.9 10*3/uL (ref 4.0–10.5)

## 2018-06-20 LAB — BASIC METABOLIC PANEL
ANION GAP: 7 (ref 5–15)
BUN: 14 mg/dL (ref 8–23)
CHLORIDE: 105 mmol/L (ref 98–111)
CO2: 25 mmol/L (ref 22–32)
Calcium: 8.4 mg/dL — ABNORMAL LOW (ref 8.9–10.3)
Creatinine, Ser: 1.03 mg/dL (ref 0.61–1.24)
GFR calc non Af Amer: >60 mL/min (ref >60)
Glucose, Bld: 98 mg/dL (ref 70–99)
POTASSIUM: 3.5 mmol/L (ref 3.5–5.1)
SODIUM: 137 mmol/L (ref 135–145)

## 2018-06-20 LAB — LEGIONELLA PNEUMOPHILA SEROGP 1 UR AG: L. PNEUMOPHILA SEROGP 1 UR AG: POSITIVE — AB

## 2018-06-20 LAB — MAGNESIUM: Magnesium: 2 mg/dL (ref 1.7–2.4)

## 2018-06-20 MED ORDER — LACTOBACILLUS PO TABS: 1.0000 | ORAL_TABLET | Freq: Three times a day (TID) | ORAL | 0 refills | Status: AC

## 2018-06-20 MED ORDER — CHLORPROMAZINE HCL 25 MG PO TABS: 25.0000 mg | ORAL_TABLET | Freq: Three times a day (TID) | ORAL | 0 refills | Status: DC | PRN

## 2018-06-20 MED ORDER — LEVOFLOXACIN 750 MG PO TABS: 750.0000 mg | ORAL_TABLET | Freq: Every day | ORAL | 0 refills | Status: AC

## 2018-06-20 MED ORDER — METOPROLOL TARTRATE 25 MG PO TABS: 12.5000 mg | ORAL_TABLET | Freq: Two times a day (BID) | ORAL | 0 refills | Status: DC

## 2018-06-20 NOTE — Discharge Summary (Signed)
Physician Discharge Summary  Millan Legan WUJ:[id number] DOB: 12-Aug-1946 DOA: 06/16/2018  PCP: Donalynn Furlong, MD  Admit date: 06/16/2018 Discharge date: 06/20/2018  Admitted From: HOME  Disposition: HOME   Recommendations for Outpatient Follow-up:  1. Follow up with PCP in 1 weeks  2. Please repeat CXR in 4 weeks to ensure resolution of pneumonia  Discharge Condition: STABLE   CODE STATUS: FULL    Brief Hospitalization Summary: Please see all hospital notes, images, labs for full details of the hospitalization.  Brief Narrative:   Evan Price a 72 y.o.malewith medical history significant ofasthma, chronic back pain, hyperlipidemia presents with nausea and vomiting. Patient went to urgent care with a complaint of nausea vomiting and myalgias for 4 days. Patient was then sent over to the ED at [facility]. Chest x-ray was done which showed a left-sided infiltrate. Patient was febrile upon presentation. He also has significant leukocytosis of 16,000. Flu was negative.He was noted to have creatinine of 1.5 with BUN 23 with no prior for comparison that is now improving. He was admitted with sepsis secondary to pneumonia along with suspected AKI. He has been started on Levaquin as well as IV hydration.  He has had some mild diarrhea and is currently on Imodium and has some mild hypokalemia as a result which was repleted.     Assessment & Plan:  Principal Problem: PNA (pneumonia) Active Problems: Dehydration AKI (acute kidney injury)  Sepsis  Chronic back pain GERD (gastroesophageal reflux disease)  1. Sepsis secondary to community-acquired pneumonia; Sepsis is resolved now. Legionella antigen positive and pneumococcal negative. Continue levofloxacin. Lactic acid of 1.1 noted. He was treated with supportive care and is feeling much better.Pt tolerating diet.  2. Legionella Pneumonia - Pt advised to finish antibiotics to completion.   3. AKI - resolved with hydration.   4. Mild diarrhea with associated hypokalemia.  Resolved now.  5. Hypokalemia - repleted.  6. Hiccups - Pt has been having them intermittently for many years but says that this current bout is not as bad.  He has been given thorazine to help with symptoms.   7. Chronic back pain. Continue home medications as needed. 8. GERD. PPI.  DVT prophylaxis:Lovenox Code Status:Full Family Communication:Son at bedside with discussion on 9/25 Disposition Plan:Home   Consultants:  None  Procedures:  None  Antimicrobials:  Levaquin 9/23-> Discharge Diagnoses:  Principal Problem:   Legionella pneumonia ([place]) Active Problems:   PNA (pneumonia)   Dehydration   AKI (acute kidney injury) ([place])   Sepsis ([place])   Chronic back pain   GERD (gastroesophageal reflux disease)  Discharge Instructions: Discharge Instructions    Call MD for:  difficulty breathing, headache or visual disturbances   Complete by:  As directed    Call MD for:  extreme fatigue   Complete by:  As directed    Call MD for:  persistant dizziness or light-headedness   Complete by:  As directed    Call MD for:  persistant nausea and vomiting   Complete by:  As directed    Increase activity slowly   Complete by:  As directed      Allergies as of 06/20/2018      Reactions   Keflex [cephalexin]       Medication List    STOP taking these medications   doxycycline 100 MG tablet Commonly known as:  VIBRA-TABS   predniSONE 10 MG tablet Commonly known as:  [place] these medications  albuterol (2.5 MG/3ML) 0.083% nebulizer solution Commonly known as:  PROVENTIL Take 2.5 mg by nebulization every 6 (six) hours as needed for wheezing or shortness of breath.   VENTOLIN HFA 108 (90 Base) MCG/ACT inhaler Generic drug:  albuterol   atorvastatin 20 MG tablet Commonly known as:  LIPITOR Take 20 mg by mouth daily.   chlorproMAZINE 25 MG tablet Commonly  known as:  THORAZINE Take 1 tablet (25 mg total) by mouth 3 (three) times daily as needed for up to 3 days for hiccoughs.   gabapentin 400 MG capsule Commonly known as:  NEURONTIN Take 1 capsule by mouth 3 (three) times daily.   HYDROcodone-acetaminophen 5-325 MG tablet Commonly known as:  NORCO/VICODIN Take 1 tablet by mouth every 6 (six) hours as needed.   Lactobacillus Tabs Take 1 tablet by mouth 3 (three) times daily for 10 days.   levofloxacin 750 MG tablet Commonly known as:  LEVAQUIN Take 1 tablet (750 mg total) by mouth daily for 6 days.   metoprolol tartrate 25 MG tablet Commonly known as:  LOPRESSOR Take 0.5 tablets (12.5 mg total) by mouth 2 (two) times daily.   Promethazine-DM 6.25-15 MG/5ML Soln      Follow-up Information    Dhivianathan, Candida Peeling, MD. Schedule an appointment as soon as possible for a visit in 1 week(s).   Specialty:  Family Medicine Why:  Hospital follow-up Contact information: [place] [address] [phone]          Allergies  Allergen Reactions  . Keflex [Cephalexin]    Allergies as of 06/20/2018      Reactions   Keflex [cephalexin]       Medication List    STOP taking these medications   doxycycline 100 MG tablet Commonly known as:  VIBRA-TABS   predniSONE 10 MG tablet Commonly known as:  DELTASONE     TAKE these medications   albuterol (2.5 MG/3ML) 0.083% nebulizer solution Commonly known as:  PROVENTIL Take 2.5 mg by nebulization every 6 (six) hours as needed for wheezing or shortness of breath.   VENTOLIN HFA 108 (90 Base) MCG/ACT inhaler Generic drug:  albuterol   atorvastatin 20 MG tablet Commonly known as:  LIPITOR Take 20 mg by mouth daily.   chlorproMAZINE 25 MG tablet Commonly known as:  THORAZINE Take 1 tablet (25 mg total) by mouth 3 (three) times daily as needed for up to 3 days for hiccoughs.   gabapentin 400 MG capsule Commonly known as:  NEURONTIN Take 1 capsule by  mouth 3 (three) times daily.   HYDROcodone-acetaminophen 5-325 MG tablet Commonly known as:  NORCO/VICODIN Take 1 tablet by mouth every 6 (six) hours as needed.   Lactobacillus Tabs Take 1 tablet by mouth 3 (three) times daily for 10 days.   levofloxacin 750 MG tablet Commonly known as:  LEVAQUIN Take 1 tablet (750 mg total) by mouth daily for 6 days.   metoprolol tartrate 25 MG tablet Commonly known as:  LOPRESSOR Take 0.5 tablets (12.5 mg total) by mouth 2 (two) times daily.   Promethazine-DM 6.25-15 MG/5ML Soln       Procedures/Studies:  No results found.   Subjective: Patient says he is feeling a lot better.  He does feel weak overall but has been able to ambulate.  Discharge Exam: Vitals:   06/19/18 2125 06/20/18 0643  BP: 100/60 129/78  Pulse:  90  Resp:  20  Temp:  98 F (36.7 C)  SpO2:  97%  Vitals:   06/19/18 2049 06/19/18 2106 06/19/18 2125 06/20/18 0643  BP:  (!) 108/44 100/60 129/78  Pulse:  100  90  Resp:  20  20  Temp:  98.2 F (36.8 C)  98 F (36.7 C)  TempSrc:  Oral  Oral  SpO2: 93% 93%  97%  Weight:      Height:       General exam: Appears calm and comfortable. He is having hiccups. Respiratory system: Clear to auscultation. Respiratory effort normal. Cardiovascular system: S1 & S2 heard, RRR. No JVD, murmurs, rubs, gallops or clicks. No pedal edema. Gastrointestinal system: Abdomen is nondistended, soft and nontender. No organomegaly or masses felt. Normal bowel sounds heard. Central nervous system: Alert and oriented. No focal neurological deficits. Extremities: Symmetric 5 x 5 power. Skin: No rashes, lesions or ulcers Psychiatry: Judgement and insight appear normal. Mood & affect appropriate.    The results of significant diagnostics from this hospitalization (including imaging, microbiology, ancillary and laboratory) are listed below for reference.     Microbiology: No results found for this or any previous visit (from the past  240 hour(s)).   Labs: BNP (last 3 results) No results for input(s): BNP in the last 8760 hours. Basic Metabolic Panel: Recent Labs  Lab 06/16/18 1923 06/17/18 0437 06/18/18 0523 06/19/18 0441 06/20/18 0456  NA  --  135 133* 132* 137  K  --  3.9 3.6 3.4* 3.5  CL  --  103 102 98 105  CO2  --  22 24 24 25   GLUCOSE  --  102* 102* 95 98  BUN  --  16 12 12 14   CREATININE 1.40* 1.34* 1.05 1.01 1.03  CALCIUM  --  8.0* 8.1* 8.2* 8.4*  MG  --   --   --   --  2.0   Liver Function Tests: Recent Labs  Lab 06/17/18 0437  AST 20  ALT 11  ALKPHOS 73  BILITOT 0.9  PROT 6.3*  ALBUMIN 2.6*   No results for input(s): LIPASE, AMYLASE in the last 168 hours. No results for input(s): AMMONIA in the last 168 hours. CBC: Recent Labs  Lab 06/16/18 1923 06/17/18 0437 06/18/18 0523 06/19/18 0441 06/20/18 0456  WBC 14.1* 10.3 7.2 6.0 4.9  NEUTROABS  --  9.0*  --   --   --   HGB 11.7* 11.6* 11.4* 11.1* 10.8*  HCT 35.5* 35.4* 34.0* 33.8* 32.8*  MCV 91.3 91.2 89.7 89.7 89.4  PLT 193 189 219 267 286   Cardiac Enzymes: Recent Labs  Lab 06/17/18 0437  CKTOTAL 174   BNP: Invalid input(s): POCBNP CBG: No results for input(s): GLUCAP in the last 168 hours. D-Dimer No results for input(s): DDIMER in the last 72 hours. Hgb A1c No results for input(s): HGBA1C in the last 72 hours. Lipid Profile No results for input(s): CHOL, HDL, LDLCALC, TRIG, CHOLHDL, LDLDIRECT in the last 72 hours. Thyroid function studies No results for input(s): TSH, T4TOTAL, T3FREE, THYROIDAB in the last 72 hours.  Invalid input(s): FREET3 Anemia work up No results for input(s): VITAMINB12, FOLATE, FERRITIN, TIBC, IRON, RETICCTPCT in the last 72 hours. Urinalysis No results found for: COLORURINE, APPEARANCEUR, LABSPEC, [place], GLUCOSEU, HGBUR, BILIRUBINUR, KETONESUR, PROTEINUR, UROBILINOGEN, NITRITE, LEUKOCYTESUR Sepsis Labs Invalid input(s): PROCALCITONIN,  WBC,  LACTICIDVEN Microbiology No results found  for this or any previous visit (from the past 240 hour(s)).  Time coordinating discharge: 35 Minutes  SIGNED:  Irwin Brakeman, MD  [facility] 06/20/2018, 11:27 AM Pager [phone]  If 7PM-7AM, please contact night-coverage [email or web address] Password [email or web address]

## 2018-06-20 NOTE — Care Management Note (Signed)
Case Management Note  Patient Details  Name: Evan Price MRN: [id number] Date of Birth: [birth year]  Subjective/Objective:    Pneumonia/sepsis.  Patient from home.   Independent.  Has PCP and insurance with prescription coverage.             Action/Plan: DC home today with home health RN follow up. Patient has no preference of providers, lives in [place]. [facility] can see patient tomorrow, referral given to Santiago Glad of [employer].   Expected Discharge Date:  06/20/18               Expected Discharge Plan:  Home/Self Care  In-House Referral:     Discharge planning Services  CM Consult  Post Acute Care Choice:  Home Health Choice offered to:  Patient  DME Arranged:    DME Agency:     HH Arranged:  RN [place] Agency:  [place]  Status of Service:  Completed, signed off  If discussed at [place] of Stay Meetings, dates discussed:    Additional Comments:  Rumaysa Sabatino, Chauncey Reading, RN 06/20/2018, 12:28 PM

## 2018-06-20 NOTE — Progress Notes (Signed)
Lab called and positive for legionella pneumonphila antigen. Contacted Dr. Wynetta Emery

## 2018-06-20 NOTE — Evaluation (Signed)
Physical Therapy Evaluation Patient Details Name: Evan Price MRN: [id number] DOB: 11/03/1945 Today's Date: 06/20/2018   History of Present Illness  Evan Price is a 72 y.o. male with medical history significant of asthma, chronic back pain, hyperlipidemia presents with nausea and vomiting.  Patient went to urgent care with a complaint of nausea vomiting and myalgias for 4 days.  Patient was then sent over to the ED at [facility].  Chest x-ray was done which showed a left-sided infiltrate.  Patient was febrile upon presentation.  He also has significant leukocytosis of 16,000.  Flu was negative.  Patient had no recent hospitalizations.  He did have pneumonia a year ago.  Denies any obvious sick contacts.  Has had very little p.o. intake for the past 4 days.  He also had one loose stool a day.  He denies any recent antibiotic usage.  Patient denies any history of kidney disease.  Patient found to have a creatinine of 1.5 with BUN 23 with no prior for comparison.  She does have a history of asthma for which he uses as needed Ventolin.    Clinical Impression  Patient functioning at baseline for functional mobility and gait, states he normally walks slow and has hip problems, able to ambulate in room and hallways without loss of balance and tolerated sitting up in chair after therapy.  Plan:  Patient discharged from physical therapy to care of nursing for ambulation daily as tolerated for length of stay.     Follow Up Recommendations No PT follow up    Equipment Recommendations  None recommended by PT    Recommendations for Other Services       Precautions / Restrictions Precautions Precautions: None Restrictions Weight Bearing Restrictions: No      Mobility  Bed Mobility Overal bed mobility: Independent                Transfers Overall transfer level: Modified independent Equipment used: None                Ambulation/Gait Ambulation/Gait assistance: Modified  independent (Device/Increase time) Gait Distance (Feet): 65 Feet Assistive device: None Gait Pattern/deviations: Decreased step length - left;Decreased stance time - left;Decreased stride length Gait velocity: decreased   General Gait Details: demonstrates slow slightly labored cadence with decreased step/stride length LLE which is baseline per patient, he has to start walking slowly when initially getting up  Stairs            Wheelchair Mobility    Modified Rankin (Stroke Patients Only)       Balance Overall balance assessment: Mild deficits observed, not formally tested                                           Pertinent Vitals/Pain Pain Assessment: No/denies pain    Home Living Family/patient expects to be discharged to:: Private residence Living Arrangements: Other relatives(daughter) Available Help at Discharge: Family Type of Home: House Home Access: Ramped entrance     Home Layout: One level Home Equipment: Cane - single point;Walker - 2 wheels;Shower seat - built in      Prior Function Level of Independence: Independent         Comments: community ambulator, drives     Journalist, newspaper        Extremity/Trunk Assessment   Upper Extremity Assessment Upper Extremity Assessment: Overall WFL for tasks  assessed    Lower Extremity Assessment Lower Extremity Assessment: Overall WFL for tasks assessed    Cervical / Trunk Assessment Cervical / Trunk Assessment: Normal  Communication   Communication: No difficulties  Cognition Arousal/Alertness: Awake/alert Behavior During Therapy: WFL for tasks assessed/performed Overall Cognitive Status: Within Functional Limits for tasks assessed                                        General Comments      Exercises     Assessment/Plan    PT Assessment Patent does not need any further PT services  PT Problem List         PT Treatment Interventions      PT Goals  (Current goals can be found in the Care Plan section)  Acute Rehab PT Goals Patient Stated Goal: return home with family to assist PT Goal Formulation: With patient Time For Goal Achievement: 06/20/18    Frequency     Barriers to discharge        Co-evaluation               AM-PAC PT "6 Clicks" Daily Activity  Outcome Measure Difficulty turning over in bed (including adjusting bedclothes, sheets and blankets)?: None Difficulty moving from lying on back to sitting on the side of the bed? : None Difficulty sitting down on and standing up from a chair with arms (e.g., wheelchair, bedside commode, etc,.)?: None Help needed moving to and from a bed to chair (including a wheelchair)?: None Help needed walking in hospital room?: None Help needed climbing 3-5 steps with a railing? : A Little 6 Click Score: 23    End of Session   Activity Tolerance: Patient tolerated treatment well;Patient limited by fatigue Patient left: in chair;with call bell/phone within reach Nurse Communication: Mobility status PT Visit Diagnosis: Unsteadiness on feet (R26.81);Other abnormalities of gait and mobility (R26.89);Muscle weakness (generalized) (M62.81)    Time: 1610-9604 PT Time Calculation (min) (ACUTE ONLY): 17 min   Charges:   PT Evaluation $PT Eval Low Complexity: 1 Low PT Treatments $Therapeutic Activity: 8-22 mins        12:20 PM, 06/20/18 Lonell Grandchild, MPT Physical Therapist with [facility] [phone] office [phone] mobile phone

## 2018-06-20 NOTE — Discharge Instructions (Signed)
Legionnaires Disease Legionnaires disease is a lung infection caused by Legionella pneumophila bacteria. These bacteria usually grow in places that hold warm water, such as:  Large air-conditioning systems.  Complex plumbing systems, such as pipes, faucets, and showerheads in hotels, hospitals, or cruise ships.  Public whirlpool spas.  Decorative fountains.  Hot water tanks.  What are the causes? This condition is caused by by Legionella pneumophila bacteria. You can get legionnaires disease by breathing in mist that contains the bacteria. What increases the risk? This condition is more likely to develop in people who:  Are age 38 or older.  Smoke.  Drink a lot of alcohol often.  Have long-lasting lung problems.  Have a weak body defense system (immune system).  Have medical conditions such as: ? Cancer. ? Kidney failure. ? Diabetes.  What are the signs or symptoms? Symptoms of this condition include:  High fever.  Shaking or chills.  Cough. This may sometimes bring up mucus or bloody mucus.  Shortness of breath.  Chest pain.  Headache.  Muscle pain.  Tiredness and weakness.  Poor appetite.  Confusion.  How is this diagnosed? This condition is diagnosed based on symptoms and a physical exam. You may have tests, such as:  A chest X-ray.  Blood tests.  Urine tests.  A tissue culture.  How is this treated? This condition is treated with antibiotic medicine. Follow these instructions at home:  Take your antibiotic medicine as told by your health care provider. Do not stop taking the antibiotic even if you start to feel better.  Drink enough fluid to keep your urine clear or pale yellow.  Rest often. Give your lungs time to return to normal.  To reduce your cough, raise (elevate) your back and head when you are in bed. Ask your health care provider about other ways to reduce your cough.  Keep all follow-up visits as told by your health care  provider. This is important. Contact a health care provider if:  Your symptoms get worse.  You become confused. Get help right away if:  You have trouble breathing.  You have chest pain. This information is not intended to replace advice given to you by your health care provider. Make sure you discuss any questions you have with your health care provider. Document Released: 02/28/2010 Document Revised: 09/03/2016 Document Reviewed: 09/03/2016 Elsevier Interactive Patient Education  2018 [place]   Community-Acquired Pneumonia, Adult Pneumonia is an infection of the lungs. There are different types of pneumonia. One type can develop while a person is in a hospital. A different type, called community-acquired pneumonia, develops in people who are not, or have not recently been, in the hospital or other health care facility. What are the causes? Pneumonia may be caused by bacteria, viruses, or funguses. Community-acquired pneumonia is often caused by Streptococcus pneumonia bacteria. These bacteria are often passed from one person to another by breathing in droplets from the cough or sneeze of an infected person. What increases the risk? The condition is more likely to develop in:  People who havechronic diseases, such as chronic obstructive pulmonary disease (COPD), asthma, congestive heart failure, cystic fibrosis, diabetes, or kidney disease.  People who haveearly-stage or late-stage HIV.  People who havesickle cell disease.  People who havehad their spleen removed (splenectomy).  People who havepoor Human resources officer.  People who havemedical conditions that increase the risk of breathing in (aspirating) secretions their own mouth and nose.  People who havea weakened immune system (immunocompromised).  People  who smoke.  People whotravel to areas where pneumonia-causing germs commonly exist.  People whoare around animal habitats or animals that have  pneumonia-causing germs, including birds, bats, rabbits, cats, and farm animals.  What are the signs or symptoms? Symptoms of this condition include:  Adry cough.  A wet (productive) cough.  Fever.  Sweating.  Chest pain, especially when breathing deeply or coughing.  Rapid breathing or difficulty breathing.  Shortness of breath.  Shaking chills.  Fatigue.  Muscle aches.  How is this diagnosed? Your health care provider will take a medical history and perform a physical exam. You may also have other tests, including:  Imaging studies of your chest, including X-rays.  Tests to check your blood oxygen level and other blood gases.  Other tests on blood, mucus (sputum), fluid around your lungs (pleural fluid), and urine.  If your pneumonia is severe, other tests may be done to identify the specific cause of your illness. How is this treated? The type of treatment that you receive depends on many factors, such as the cause of your pneumonia, the medicines you take, and other medical conditions that you have. For most adults, treatment and recovery from pneumonia may occur at home. In some cases, treatment must happen in a hospital. Treatment may include:  Antibiotic medicines, if the pneumonia was caused by bacteria.  Antiviral medicines, if the pneumonia was caused by a virus.  Medicines that are given by mouth or through an IV tube.  Oxygen.  Respiratory therapy.  Although rare, treating severe pneumonia may include:  Mechanical ventilation. This is done if you are not breathing well on your own and you cannot maintain a safe blood oxygen level.  Thoracentesis. This procedureremoves fluid around one lung or both lungs to help you breathe better.  Follow these instructions at home:  Take over-the-counter and prescription medicines only as told by your health care provider. ? Only takecough medicine if you are losing sleep. Understand that cough medicine can  prevent your bodys natural ability to remove mucus from your lungs. ? If you were prescribed an antibiotic medicine, take it as told by your health care provider. Do not stop taking the antibiotic even if you start to feel better.  Sleep in a semi-upright position at night. Try sleeping in a reclining chair, or place a few pillows under your head.  Do not use tobacco products, including cigarettes, chewing tobacco, and e-cigarettes. If you need help quitting, ask your health care provider.  Drink enough water to keep your urine clear or pale yellow. This will help to thin out mucus secretions in your lungs. How is this prevented? There are ways that you can decrease your risk of developing community-acquired pneumonia. Consider getting a pneumococcal vaccine if:  You are older than 73 years of age.  You are older than 73 years of age and are undergoing cancer treatment, have chronic lung disease, or have other medical conditions that affect your immune system. Ask your health care provider if this applies to you.  There are different types and schedules of pneumococcal vaccines. Ask your health care provider which vaccination option is best for you. You may also prevent community-acquired pneumonia if you take these actions:  Get an influenza vaccine every year. Ask your health care provider which type of influenza vaccine is best for you.  Go to the dentist on a regular basis.  Wash your hands often. Use hand sanitizer if soap and water are not available.  Contact  a health care provider if:  You have a fever.  You are losing sleep because you cannot control your cough with cough medicine. Get help right away if:  You have worsening shortness of breath.  You have increased chest pain.  Your sickness becomes worse, especially if you are an older adult or have a weakened immune system.  You cough up blood. This information is not intended to replace advice given to you by your  health care provider. Make sure you discuss any questions you have with your health care provider. Document Released: 09/10/2005 Document Revised: 01/19/2016 Document Reviewed: 01/05/2015 Elsevier Interactive Patient Education  2018 [employer].   Follow with Primary MD  Dhivianathan, Candida Peeling, MD  and other consultant's as instructed your Hospitalist MD  Please get a complete blood count and chemistry panel checked by your Primary MD at your next visit, and again as instructed by your Primary MD.  Get Medicines reviewed and adjusted: Please take all your medications with you for your next visit with your Primary MD  Laboratory/radiological data: Please request your Primary MD to go over all hospital tests and procedure/radiological results at the follow up, please ask your Primary MD to get all Hospital records sent to his/her office.  In some cases, they will be blood work, cultures and biopsy results pending at the time of your discharge. Please request that your primary care M.D. follows up on these results.  Also Note the following: If you experience worsening of your admission symptoms, develop shortness of breath, life threatening emergency, suicidal or homicidal thoughts you must seek medical attention immediately by calling 911 or calling your MD immediately  if symptoms less severe.  You must read complete instructions/literature along with all the possible adverse reactions/side effects for all the Medicines you take and that have been prescribed to you. Take any new Medicines after you have completely understood and accpet all the possible adverse reactions/side effects.   Do not drive when taking Pain medications or sleeping medications (Benzodaizepines)  Do not take more than prescribed Pain, Sleep and Anxiety Medications. It is not advisable to combine anxiety,sleep and pain medications without talking with your primary care practitioner  Special Instructions: If you have  smoked or chewed Tobacco  in the last 2 yrs please stop smoking, stop any regular Alcohol  and or any Recreational drug use.  Wear Seat belts while driving.  Please note: You were cared for by a hospitalist during your hospital stay. Once you are discharged, your primary care physician will handle any further medical issues. Please note that NO REFILLS for any discharge medications will be authorized once you are discharged, as it is imperative that you return to your primary care physician (or establish a relationship with a primary care physician if you do not have one) for your post hospital discharge needs so that they can reassess your need for medications and monitor your lab values.

## 2018-06-20 NOTE — Progress Notes (Signed)
IV removed earlier and discharge instructions reviewed. Scripts given

## 2018-06-20 NOTE — Care Management Important Message (Signed)
Important Message  Patient Details  Name: Tag Wurtz MRN: [id number] Date of Birth: 10/27/1945   Medicare Important Message Given:  Yes    Barb Shear, Chauncey Reading, RN 06/20/2018, 11:15 AM

## 2019-01-28 ENCOUNTER — Emergency Department (HOSPITAL_COMMUNITY): Payer: Medicare Other

## 2019-01-28 ENCOUNTER — Encounter (HOSPITAL_COMMUNITY): Payer: Self-pay

## 2019-01-28 ENCOUNTER — Other Ambulatory Visit: Payer: Self-pay

## 2019-01-28 ENCOUNTER — Inpatient Hospital Stay (HOSPITAL_COMMUNITY)
Admission: EM | Admit: 2019-01-28 | Discharge: 2019-02-03 | DRG: 871 | Disposition: A | Discharge status: Home / Self Care | Payer: Medicare Other | Attending: Internal Medicine | Admitting: Internal Medicine

## 2019-01-28 DIAGNOSIS — D75839 Thrombocytosis, unspecified: Secondary | ICD-10-CM | POA: Diagnosis present

## 2019-01-28 DIAGNOSIS — J209 Acute bronchitis, unspecified: Secondary | ICD-10-CM | POA: Diagnosis present

## 2019-01-28 DIAGNOSIS — J189 Pneumonia, unspecified organism: Secondary | ICD-10-CM | POA: Diagnosis not present

## 2019-01-28 DIAGNOSIS — K219 Gastro-esophageal reflux disease without esophagitis: Secondary | ICD-10-CM | POA: Diagnosis present

## 2019-01-28 DIAGNOSIS — E785 Hyperlipidemia, unspecified: Secondary | ICD-10-CM | POA: Diagnosis present

## 2019-01-28 DIAGNOSIS — D509 Iron deficiency anemia, unspecified: Secondary | ICD-10-CM | POA: Diagnosis not present

## 2019-01-28 DIAGNOSIS — Z20828 Contact with and (suspected) exposure to other viral communicable diseases: Secondary | ICD-10-CM | POA: Diagnosis present

## 2019-01-28 DIAGNOSIS — I959 Hypotension, unspecified: Secondary | ICD-10-CM | POA: Diagnosis present

## 2019-01-28 DIAGNOSIS — J181 Lobar pneumonia, unspecified organism: Secondary | ICD-10-CM | POA: Diagnosis present

## 2019-01-28 DIAGNOSIS — Z79891 Long term (current) use of opiate analgesic: Secondary | ICD-10-CM

## 2019-01-28 DIAGNOSIS — E43 Unspecified severe protein-calorie malnutrition: Secondary | ICD-10-CM | POA: Diagnosis present

## 2019-01-28 DIAGNOSIS — D500 Iron deficiency anemia secondary to blood loss (chronic): Secondary | ICD-10-CM

## 2019-01-28 DIAGNOSIS — I351 Nonrheumatic aortic (valve) insufficiency: Secondary | ICD-10-CM | POA: Diagnosis not present

## 2019-01-28 DIAGNOSIS — A419 Sepsis, unspecified organism: Secondary | ICD-10-CM | POA: Diagnosis not present

## 2019-01-28 DIAGNOSIS — E538 Deficiency of other specified B group vitamins: Secondary | ICD-10-CM | POA: Diagnosis present

## 2019-01-28 DIAGNOSIS — D649 Anemia, unspecified: Secondary | ICD-10-CM | POA: Diagnosis not present

## 2019-01-28 DIAGNOSIS — D508 Other iron deficiency anemias: Secondary | ICD-10-CM | POA: Diagnosis not present

## 2019-01-28 DIAGNOSIS — Z79899 Other long term (current) drug therapy: Secondary | ICD-10-CM

## 2019-01-28 DIAGNOSIS — Z833 Family history of diabetes mellitus: Secondary | ICD-10-CM | POA: Diagnosis not present

## 2019-01-28 DIAGNOSIS — Z5320 Procedure and treatment not carried out because of patient's decision for unspecified reasons: Secondary | ICD-10-CM | POA: Insufficient documentation

## 2019-01-28 DIAGNOSIS — N182 Chronic kidney disease, stage 2 (mild): Secondary | ICD-10-CM

## 2019-01-28 DIAGNOSIS — G8929 Other chronic pain: Secondary | ICD-10-CM | POA: Diagnosis present

## 2019-01-28 DIAGNOSIS — Z8249 Family history of ischemic heart disease and other diseases of the circulatory system: Secondary | ICD-10-CM | POA: Diagnosis not present

## 2019-01-28 DIAGNOSIS — M549 Dorsalgia, unspecified: Secondary | ICD-10-CM | POA: Diagnosis present

## 2019-01-28 DIAGNOSIS — D473 Essential (hemorrhagic) thrombocythemia: Secondary | ICD-10-CM

## 2019-01-28 DIAGNOSIS — J45909 Unspecified asthma, uncomplicated: Secondary | ICD-10-CM | POA: Diagnosis present

## 2019-01-28 DIAGNOSIS — E876 Hypokalemia: Secondary | ICD-10-CM | POA: Diagnosis present

## 2019-01-28 DIAGNOSIS — Z125 Encounter for screening for malignant neoplasm of prostate: Secondary | ICD-10-CM | POA: Insufficient documentation

## 2019-01-28 LAB — URINALYSIS, ROUTINE W REFLEX MICROSCOPIC
Bacteria, UA: NONE SEEN
Bilirubin Urine: NEGATIVE
Glucose, UA: NEGATIVE mg/dL
Ketones, ur: 20 mg/dL — AB
Leukocytes,Ua: NEGATIVE
Nitrite: NEGATIVE
Protein, ur: NEGATIVE mg/dL
Specific Gravity, Urine: 1.018 (ref 1.005–1.030)
pH: 5 (ref 5.0–8.0)

## 2019-01-28 LAB — COMPREHENSIVE METABOLIC PANEL
ALT: 14 U/L (ref 0–44)
AST: 17 U/L (ref 15–41)
Albumin: 2.4 g/dL — ABNORMAL LOW (ref 3.5–5.0)
Alkaline Phosphatase: 104 U/L (ref 38–126)
Anion gap: 12 (ref 5–15)
BUN: 19 mg/dL (ref 8–23)
CO2: 22 mmol/L (ref 22–32)
Calcium: 8.6 mg/dL — ABNORMAL LOW (ref 8.9–10.3)
Chloride: 102 mmol/L (ref 98–111)
Creatinine, Ser: 1.14 mg/dL (ref 0.61–1.24)
GFR calc Af Amer: >60 mL/min (ref >60)
GFR calc non Af Amer: >60 mL/min (ref >60)
Glucose, Bld: 104 mg/dL — ABNORMAL HIGH (ref 70–99)
Potassium: 3.9 mmol/L (ref 3.5–5.1)
Sodium: 136 mmol/L (ref 135–145)
Total Bilirubin: 0.9 mg/dL (ref 0.3–1.2)
Total Protein: 6.9 g/dL (ref 6.5–8.1)

## 2019-01-28 LAB — BRAIN NATRIURETIC PEPTIDE: B Natriuretic Peptide: 77 pg/mL (ref 0.0–100.0)

## 2019-01-28 LAB — CBC WITH DIFFERENTIAL/PLATELET
Abs Immature Granulocytes: 0.14 10*3/uL — ABNORMAL HIGH (ref 0.00–0.07)
Basophils Absolute: 0.1 10*3/uL (ref 0.0–0.1)
Basophils Relative: 0 %
Eosinophils Absolute: 0.1 10*3/uL (ref 0.0–0.5)
Eosinophils Relative: 0 %
HCT: 30.3 % — ABNORMAL LOW (ref 39.0–52.0)
Hemoglobin: 9.5 g/dL — ABNORMAL LOW (ref 13.0–17.0)
Immature Granulocytes: 1 %
Lymphocytes Relative: 5 %
Lymphs Abs: 0.8 10*3/uL (ref 0.7–4.0)
MCH: 27.4 pg (ref 26.0–34.0)
MCHC: 31.4 g/dL (ref 30.0–36.0)
MCV: 87.3 fL (ref 80.0–100.0)
Monocytes Absolute: 1.3 10*3/uL — ABNORMAL HIGH (ref 0.1–1.0)
Monocytes Relative: 8 %
Neutro Abs: 14.6 10*3/uL — ABNORMAL HIGH (ref 1.7–7.7)
Neutrophils Relative %: 86 %
Platelets: 465 10*3/uL — ABNORMAL HIGH (ref 150–400)
RBC: 3.47 MIL/uL — ABNORMAL LOW (ref 4.22–5.81)
RDW: 14.8 % (ref 11.5–15.5)
WBC: 17 10*3/uL — ABNORMAL HIGH (ref 4.0–10.5)
nRBC: 0 % (ref 0.0–0.2)

## 2019-01-28 LAB — LACTIC ACID, PLASMA
Lactic Acid, Venous: 2.3 mmol/L (ref 0.5–1.9)
Lactic Acid, Venous: 1.2 mmol/L (ref 0.5–1.9)

## 2019-01-28 LAB — D-DIMER, QUANTITATIVE: D-Dimer, Quant: 1.83 ug/mL-FEU — ABNORMAL HIGH (ref 0.00–0.50)

## 2019-01-28 LAB — PROTIME-INR
INR: 1.4 — ABNORMAL HIGH (ref 0.8–1.2)
Prothrombin Time: 17 seconds — ABNORMAL HIGH (ref 11.4–15.2)

## 2019-01-28 LAB — SARS CORONAVIRUS 2 BY RT PCR (HOSPITAL ORDER, PERFORMED IN ~~LOC~~ HOSPITAL LAB): SARS Coronavirus 2: NEGATIVE

## 2019-01-28 LAB — TROPONIN I: Troponin I: <0.03 ng/mL (ref <0.03)

## 2019-01-28 MED ORDER — ALBUTEROL SULFATE HFA 108 (90 BASE) MCG/ACT IN AERS
2.0000 | INHALATION_SPRAY | Freq: Once | RESPIRATORY_TRACT | Status: AC
  Administered 2019-01-28: 2 via RESPIRATORY_TRACT
  Filled 2019-01-28: qty 6.7

## 2019-01-28 MED ORDER — SODIUM CHLORIDE 0.9 % IV BOLUS
1000.0000 mL | Freq: Once | INTRAVENOUS | Status: AC
  Administered 2019-01-28: 1000 mL via INTRAVENOUS

## 2019-01-28 MED ORDER — LEVOFLOXACIN IN D5W 750 MG/150ML IV SOLN
750.0000 mg | Freq: Once | INTRAVENOUS | Status: AC
  Administered 2019-01-28: 750 mg via INTRAVENOUS
  Filled 2019-01-28: qty 150

## 2019-01-28 MED ORDER — SODIUM CHLORIDE 0.9 % IV BOLUS
500.0000 mL | Freq: Once | INTRAVENOUS | Status: AC
  Administered 2019-01-28: 500 mL via INTRAVENOUS

## 2019-01-28 MED ORDER — SODIUM CHLORIDE 0.9% FLUSH
3.0000 mL | Freq: Once | INTRAVENOUS | Status: AC
  Administered 2019-01-28: 3 mL via INTRAVENOUS

## 2019-01-28 MED ORDER — IPRATROPIUM BROMIDE HFA 17 MCG/ACT IN AERS
2.0000 | INHALATION_SPRAY | Freq: Once | RESPIRATORY_TRACT | Status: DC
  Filled 2019-01-28: qty 12.9

## 2019-01-28 NOTE — H&P (Addendum)
TRH H&P    Patient Demographics:    Evan Price, is a 73 y.o. male  MRN: [id number]  DOB - January 01, 1946  Admit Date - 01/28/2019  Referring MD/NP/PA: Alphonzo Lemmings  Outpatient Primary MD for the patient is Dhivianathan, Candida Peeling, MD  Patient coming from:  home  Chief complaint- cough, dyspnea   HPI:    Evan Price  is a 73 y.o. male,w history of asthma (not on home o2),  chronic back pain, apparently c/o cough with green sputum and dyspnea worsening for the past 4 days (since Sunday).  Pt notes sharp chest pain with cough.  Pt denies sick contact, recent travel or covid exposure.  Pt denies fever, chills, palp, n/v, abd pain, diarrhea, brbpr, black stool.  Pt states that his appetite has been poor.  Pt went to ER in [place] earlier today and was given dose of iv abx and told that he had pneumonia. Pt was did not get oral abx and his cough and dyspnea were worsening so presented to [facility].   In ED,  T 99.2 P 116=> 93,  Bp 84/65  Pox 98% on RA Wt 66.7 kg  CXR IMPRESSION: Borderline cardiomegaly with vascular congestion. Peribronchial thickening may reflect bronchitis.  Covid negative  Wbc 17.0, Hgb 9.5, Plt 465  (neutrophils 86%) INR 1.4 Lactic acid 2.3 (high) BNP 77 Na 136, K 3.9 Glucose 104 Ast 17, Alt 14, Alb 2.4 Bun 19, Creatinine 1.14 Trop <0.03  EKG sinus tach at 110, nl axis, nl int, no st-t changes c/w ischemia  Pt received NS 1.5 L iv x1 Albuterol/ Atrovent 2puff x1 Levaquin 579m iv x1   Pt will be admitted for hypotension, ? Sepsis (tachycardia, hypotension, elevation in lactic acid) secondary to CAP r/o PE     Review of systems:    In addition to the HPI above,  No Fever-chills, No Headache, No changes with Vision or hearing, No problems swallowing food or Liquids, + decrease in appetite No Abdominal pain, No Nausea or Vomiting, bowel movements are regular,  No Blood in stool or Urine, No dysuria, No new skin rashes or bruises, No new joints pains-aches,  No new weakness, tingling, numbness in any extremity, No recent weight gain or loss, No polyuria, polydypsia or polyphagia, No significant Mental Stressors.  All other systems reviewed and are negative.    Past History of the following :    Past Medical History:  Diagnosis Date  . Asthma   . Chronic back pain 2015  . Dyslipidemia   . GERD (gastroesophageal reflux disease)   . Pneumonia       Past Surgical History:  Procedure Laterality Date  . APPENDECTOMY    . CHOLECYSTECTOMY        Social History:      Social History   Tobacco Use  . Smoking status: Never Smoker  . Smokeless tobacco: Never Used  Substance Use Topics  . Alcohol use: Yes    Alcohol/week: 3.0 standard drinks    Types: 3 Cans of beer  per week    Comment: 3 cans beer/ week       Family History :     Family History  Problem Relation Age of Onset  . Diabetes Mother   . Hypertension Father        Home Medications:   Prior to Admission medications   Medication Sig Start Date End Date Taking? Authorizing Provider  albuterol (PROVENTIL) (2.5 MG/3ML) 0.083% nebulizer solution Take 2.5 mg by nebulization every 6 (six) hours as needed for wheezing or shortness of breath.  04/15/18  Yes [provider]  gabapentin (NEURONTIN) 400 MG capsule Take 1 capsule by mouth 3 (three) times daily. 06/12/18  Yes [provider]  HYDROcodone-acetaminophen (NORCO/VICODIN) 5-325 MG tablet Take 1 tablet by mouth every 6 (six) hours as needed. 06/12/18  Yes [provider]  VENTOLIN HFA 108 (90 Base) MCG/ACT inhaler  05/27/18  Yes [provider]  chlorproMAZINE (THORAZINE) 25 MG tablet Take 1 tablet (25 mg total) by mouth 3 (three) times daily as needed for up to 3 days for hiccoughs. 06/20/18 06/23/18  Murlean Iba, MD     Allergies:     Allergies  Allergen Reactions  .  Keflex [Cephalexin] Other (See Comments)    unknown     Physical Exam:   Vitals  Blood pressure 120/67, pulse 93, temperature 98.2 F (36.8 C), temperature source Oral, resp. rate (!) 25, height '5\' 11"'  (1.803 m), weight 69 kg, SpO2 100 %.  1.  General: axox3  2. Psychiatric: euthymic  3. Neurologic: cn2-12 intact, reflexes 2+ symmetric, diffuse with no clonus, motor 5/5 in all 4 ext  4. HEENMT:  Anicteric, pupils 1.69m symmetric, direct, consensual, near intact,  Mucous membranes dry Neck: no jvd, no bruit, no adenopathy  5. Respiratory : Slight crackles at left lung base, ? Slight e=> a LUL and LLL, faint exp wheeze  6. Cardiovascular : Borderline tachycardia s1, s2, no m/g/r  7. Gastrointestinal:  Abd: soft, nt, nd, +bs, no hsm  8. Skin:  Ext: no c/c/e, no rash  9.Musculoskeletal:  Good ROM  No adenopathy    Data Review:    CBC Recent Labs  Lab 01/28/19 1949  WBC 17.0*  HGB 9.5*  HCT 30.3*  PLT 465*  MCV 87.3  MCH 27.4  MCHC 31.4  RDW 14.8  LYMPHSABS 0.8  MONOABS 1.3*  EOSABS 0.1  BASOSABS 0.1   ------------------------------------------------------------------------------------------------------------------  Results for orders placed or performed during the hospital encounter of 01/28/19 (from the past 48 hour(s))  Urinalysis, Routine w reflex microscopic     Status: Abnormal   Collection Time: 01/28/19  6:45 PM  Result Value Ref Range   Color, Urine AMBER (A) YELLOW    Comment: BIOCHEMICALS MAY BE AFFECTED BY COLOR   APPearance CLEAR CLEAR   Specific Gravity, Urine 1.018 1.005 - 1.030   pH 5.0 5.0 - 8.0   Glucose, UA NEGATIVE NEGATIVE mg/dL   Hgb urine dipstick SMALL (A) NEGATIVE   Bilirubin Urine NEGATIVE NEGATIVE   Ketones, ur 20 (A) NEGATIVE mg/dL   Protein, ur NEGATIVE NEGATIVE mg/dL   Nitrite NEGATIVE NEGATIVE   Leukocytes,Ua NEGATIVE NEGATIVE   RBC / HPF 0-5 0 - 5 RBC/hpf   WBC, UA 0-5 0 - 5 WBC/hpf   Bacteria, UA NONE SEEN  NONE SEEN   Mucus PRESENT     Comment: Performed at [facility] [address], [place] [address] SARS Coronavirus 2 (CEPHEID- Performed in [place]), Hosp Order     Status: None   Collection Time: 01/28/19  7:35 PM  Result Value Ref Range   SARS Coronavirus 2 NEGATIVE NEGATIVE    Comment: (NOTE) If result is NEGATIVE SARS-CoV-2 target nucleic acids are NOT DETECTED. The SARS-CoV-2 RNA is generally detectable in upper and lower  respiratory specimens during the acute phase of infection. The lowest  concentration of SARS-CoV-2 viral copies this assay can detect is 250  copies / mL. A negative result does not preclude SARS-CoV-2 infection  and should not be used as the sole basis for treatment or other  patient management decisions.  A negative result may occur with  improper specimen collection / handling, submission of specimen other  than nasopharyngeal swab, presence of viral mutation(s) within the  areas targeted by this assay, and inadequate number of viral copies  (<250 copies / mL). A negative result must be combined with clinical  observations, patient history, and epidemiological information. If result is POSITIVE SARS-CoV-2 target nucleic acids are DETECTED. The SARS-CoV-2 RNA is generally detectable in upper and lower  respiratory specimens dur ing the acute phase of infection.  Positive  results are indicative of active infection with SARS-CoV-2.  Clinical  correlation with patient history and other diagnostic information is  necessary to determine patient infection status.  Positive results do  not rule out bacterial infection or co-infection with other viruses. If result is PRESUMPTIVE POSTIVE SARS-CoV-2 nucleic acids MAY BE PRESENT.   A presumptive positive result was obtained on the submitted specimen  and confirmed on repeat testing.  While 2019 novel coronavirus  (SARS-CoV-2) nucleic acids may be present in the submitted sample   additional confirmatory testing may be necessary for epidemiological  and / or clinical management purposes  to differentiate between  SARS-CoV-2 and other Sarbecovirus currently known to infect humans.  If clinically indicated additional testing with an alternate test  methodology [phone]) is advised. The SARS-CoV-2 RNA is generally  detectable in upper and lower respiratory sp ecimens during the acute  phase of infection. The expected result is Negative. Fact Sheet for Patients:  StrictlyIdeas.no Fact Sheet for Healthcare Providers: BankingDealers.co.za This test is not yet approved or cleared by the [place] FDA and has been authorized for detection and/or diagnosis of SARS-CoV-2 by FDA under an Emergency Use Authorization (EUA).  This EUA will remain in effect (meaning this test can be used) for the duration of the COVID-19 declaration under Section 564(b)(1) of the Act, 21 U.S.C. section 360bbb-3(b)(1), unless the authorization is terminated or revoked sooner. Performed at [facility], [address] [place], [place] [address]   Comprehensive metabolic panel     Status: Abnormal   Collection Time: 01/28/19  7:49 PM  Result Value Ref Range   Sodium 136 135 - 145 mmol/L   Potassium 3.9 3.5 - 5.1 mmol/L   Chloride 102 98 - 111 mmol/L   CO2 22 22 - 32 mmol/L   Glucose, Bld 104 (H) 70 - 99 mg/dL   BUN 19 8 - 23 mg/dL   Creatinine, Ser 1.14 0.61 - 1.24 mg/dL   Calcium 8.6 (L) 8.9 - 10.3 mg/dL   Total Protein 6.9 6.5 - 8.1 g/dL   Albumin 2.4 (L) 3.5 - 5.0 g/dL   AST 17 15 - 41 U/L   ALT 14 0 - 44 U/L   Alkaline Phosphatase 104 38 - 126 U/L   Total Bilirubin 0.9 0.3 - 1.2 mg/dL   GFR calc  non Af Amer >60 >60 mL/min   GFR calc Af Amer >60 >60 mL/min   Anion gap 12 5 - 15    Comment: Performed at [facility], [address], [place], [place] [address]  Lactic acid, plasma     Status: Abnormal   Collection Time: 01/28/19  7:49  PM  Result Value Ref Range   Lactic Acid, Venous 2.3 (HH) 0.5 - 1.9 mmol/L    Comment: CRITICAL RESULT CALLED TO, READ BACK BY AND VERIFIED WITH: SAPPELT,J ON 01/28/19 AT 2050 BY LOY,C Performed at [facility], [address] [place], [place] [address]   CBC with Differential     Status: Abnormal   Collection Time: 01/28/19  7:49 PM  Result Value Ref Range   WBC 17.0 (H) 4.0 - 10.5 K/uL   RBC 3.47 (L) 4.22 - 5.81 MIL/uL   Hemoglobin 9.5 (L) 13.0 - 17.0 g/dL   HCT 30.3 (L) 39.0 - 52.0 %   MCV 87.3 80.0 - 100.0 fL   MCH 27.4 26.0 - 34.0 pg   MCHC 31.4 30.0 - 36.0 g/dL   RDW 14.8 11.5 - 15.5 %   Platelets 465 (H) 150 - 400 K/uL   nRBC 0.0 0.0 - 0.2 %   Neutrophils Relative % 86 %   Neutro Abs 14.6 (H) 1.7 - 7.7 K/uL   Lymphocytes Relative 5 %   Lymphs Abs 0.8 0.7 - 4.0 K/uL   Monocytes Relative 8 %   Monocytes Absolute 1.3 (H) 0.1 - 1.0 K/uL   Eosinophils Relative 0 %   Eosinophils Absolute 0.1 0.0 - 0.5 K/uL   Basophils Relative 0 %   Basophils Absolute 0.1 0.0 - 0.1 K/uL   Immature Granulocytes 1 %   Abs Immature Granulocytes 0.14 (H) 0.00 - 0.07 K/uL    Comment: Performed at [facility], [address], [place], [place] [address]  Protime-INR     Status: Abnormal   Collection Time: 01/28/19  7:49 PM  Result Value Ref Range   Prothrombin Time 17.0 (H) 11.4 - 15.2 seconds   INR 1.4 (H) 0.8 - 1.2    Comment: (NOTE) INR goal varies based on device and disease states. Performed at [facility], [address] [place], [place] [address]   Brain natriuretic peptide     Status: None   Collection Time: 01/28/19  7:49 PM  Result Value Ref Range   B Natriuretic Peptide 77.0 0.0 - 100.0 pg/mL    Comment: Performed at [facility], [address], [place], [place] [address]  Troponin I - ONCE - STAT     Status: None   Collection Time: 01/28/19  7:49 PM  Result Value Ref Range   Troponin I <0.03 <0.03 ng/mL    Comment: Performed at [facility], [address], [place], [place]  [address]  D-dimer, quantitative (not at [facility])     Status: Abnormal   Collection Time: 01/28/19  7:50 PM  Result Value Ref Range   D-Dimer, Quant 1.83 (H) 0.00 - 0.50 ug/mL-FEU    Comment: (NOTE) At the manufacturer cut-off of 0.50 ug/mL FEU, this assay has been documented to exclude PE with a sensitivity and negative predictive value of 97 to 99%.  At this time, this assay has not been approved by the FDA to exclude DVT/VTE. Results should be correlated with clinical presentation. Performed at [facility], [address] [place], [place] [address]   Lactic acid, plasma     Status: None  Collection Time: 01/28/19  9:40 PM  Result Value Ref Range   Lactic Acid, Venous 1.2 0.5 - 1.9 mmol/L    Comment: Performed at [facility], [address] [place], [place] [address]    Chemistries  Recent Labs  Lab 01/28/19 1949  NA 136  K 3.9  CL 102  CO2 22  GLUCOSE 104*  BUN 19  CREATININE 1.14  CALCIUM 8.6*  AST 17  ALT 14  ALKPHOS 104  BILITOT 0.9   ------------------------------------------------------------------------------------------------------------------  ------------------------------------------------------------------------------------------------------------------ GFR: Estimated Creatinine Clearance: 56.3 mL/min (by C-G formula based on SCr of 1.14 mg/dL). Liver Function Tests: Recent Labs  Lab 01/28/19 1949  AST 17  ALT 14  ALKPHOS 104  BILITOT 0.9  PROT 6.9  ALBUMIN 2.4*   No results for input(s): LIPASE, AMYLASE in the last 168 hours. No results for input(s): AMMONIA in the last 168 hours. Coagulation Profile: Recent Labs  Lab 01/28/19 1949  INR 1.4*   Cardiac Enzymes: Recent Labs  Lab 01/28/19 1949  TROPONINI <0.03   BNP (last 3 results) No results for input(s): PROBNP in the last 8760 hours. HbA1C: No results for input(s): HGBA1C in the last 72 hours. CBG: No results for input(s): GLUCAP in the last 168 hours. Lipid Profile: No results for  input(s): CHOL, HDL, LDLCALC, TRIG, CHOLHDL, LDLDIRECT in the last 72 hours. Thyroid Function Tests: No results for input(s): TSH, T4TOTAL, FREET4, T3FREE, THYROIDAB in the last 72 hours. Anemia Panel: No results for input(s): VITAMINB12, FOLATE, FERRITIN, TIBC, IRON, RETICCTPCT in the last 72 hours.  --------------------------------------------------------------------------------------------------------------- Urine analysis:    Component Value Date/Time   COLORURINE AMBER (A) 01/28/2019 1845   APPEARANCEUR CLEAR 01/28/2019 1845   LABSPEC 1.018 01/28/2019 1845   PHURINE 5.0 01/28/2019 1845   GLUCOSEU NEGATIVE 01/28/2019 1845   HGBUR SMALL (A) 01/28/2019 1845   BILIRUBINUR NEGATIVE 01/28/2019 1845   KETONESUR 20 (A) 01/28/2019 1845   PROTEINUR NEGATIVE 01/28/2019 1845   NITRITE NEGATIVE 01/28/2019 1845   LEUKOCYTESUR NEGATIVE 01/28/2019 1845      Imaging Results:    Dg Chest Port 1 View  Result Date: 01/28/2019 CLINICAL DATA:  Cough, congestion EXAM: PORTABLE CHEST 1 VIEW COMPARISON:  None. FINDINGS: Heart is borderline in size. Vascular congestion. Peribronchial thickening. No confluent opacities, effusions or edema. No acute bony abnormality. IMPRESSION: Borderline cardiomegaly with vascular congestion. Peribronchial thickening may reflect bronchitis. Electronically Signed   By: Rolm Baptise M.D.   On: 01/28/2019 19:54       Assessment & Plan:    Principal Problem:   Hypotension Active Problems:   CAP (community acquired pneumonia)   Sepsis ([place])   Chronic back pain   Anemia   Thrombocytosis ([facility])   Severe protein-calorie malnutrition ([place])   Hypotension/ Tachycardia Tele D dimer, if positive then CTA chest r/o PE Trop I q6h x3 Check TSH Check cardiac echo Blood culture x2 Repeat lactic acid Cont NS iv Start Vanco/ Levaquin for ? Sepsis secondary to CAP  Sepsis ? (tachycardia, hypotension, elevated lactic acid, leukocytosis) from   CAP (not present on CXR,  but ? Due to dehydration) Blood culture x2 Urine strep, urine legionella antigen Cont Ns iv Start Vanco iv pharmacy to dose, Levaquin    Leukocytosis Check CBC in am  Anemia Check ferritin, iron, tibc, b12, folate, esr, FOBT (RN to collect) Consider spep, upep if ESR high in light of c/o chronic back pain Check cbc in am Consider GI consultation for colonoscopy  Thrombocytosis ddx iron deficiency  vs inflammation Check cbc in am  Severe protein calorie malnutrition Start prostat 70m po bid  Chronic back pain Cont Gabapentin  Cont Norco prn    DVT Prophylaxis-   Lovenox, SCD  AM Labs Ordered, also please review Full Orders  Family Communication: Admission, patients condition and plan of care including tests being ordered have been discussed with the patient  who indicate understanding and agree with the plan and Code Status.  Code Status:  FULL CODE  Admission status: Inpatient: Based on patients clinical presentation and evaluation of above clinical data, I have made determination that patient meets Inpatient criteria at this time.  Pt admitted for hypotension (sbp 80's) requiring iv hydration. Pt is possibly suffering from sepsis (hypotension, tachycardia, leukocytosis, elevated lactic acid), anemia w/o prior colonoscopy  Pt will require iv abx, as well as iv hydration which will require > 2 nites stay to ensure clinical improvement  Time spent in minutes : 70   JJani GravelM.D on 01/29/2019 at 12:19 AM

## 2019-01-28 NOTE — ED Triage Notes (Signed)
Pt reports that he has been congested for weeks and gotten worse for the past week. He went to [facility] urgent care and was dx with pneumonia and was given IV fluids and IV antibiotics. Pt reports he was given a prescription but felt like he was getting worse with his breathing and came to ED

## 2019-01-28 NOTE — ED Notes (Signed)
Phlebotomy at bedside to draw repeat Lactic

## 2019-01-28 NOTE — ED Notes (Signed)
RN attempted to start IV but could not establish access. Another RN will attempt to establish IV access and obtain Blood Cultures and samples.

## 2019-01-28 NOTE — ED Notes (Signed)
DR Stark Jock notified

## 2019-01-28 NOTE — ED Notes (Signed)
ED Provider at bedside. 

## 2019-01-28 NOTE — ED Provider Notes (Signed)
[facility] EMERGENCY DEPARTMENT Provider Note   CSN: [id number] Arrival date & time: 01/28/19  3329    History   Chief Complaint Chief Complaint  Patient presents with  . Pneumonia    HPI Evan Price is a 73 y.o. male.     Patient with hx asthma, c/o productive cough in the past 4-5 days. Symptoms acute onset, episodic, persistent, felt worse today. Went to an urgent care and was told had pna, and given abx rx - pt did not fill it yet, states he came to ED b/c he felt worse. Mild sob. Denies chest pain or discomfort. No sore throat or runny nose. Subjective fever. No specific known ill contacts or known COVID + exposure.   The history is provided by the patient.  Pneumonia  Associated symptoms include shortness of breath. Pertinent negatives include no chest pain, no abdominal pain and no headaches.    Past Medical History:  Diagnosis Date  . Asthma   . Chronic back pain 2015  . Dyslipidemia   . Dyspnea   . GERD (gastroesophageal reflux disease)   . Pneumonia   . Pneumonia     Patient Active Problem List   Diagnosis Date Noted  . 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 PNA (pneumonia) 06/16/2018  . Dehydration 06/16/2018  . AKI (acute kidney injury) ([place]) 06/16/2018  . Sepsis ([place]) 06/16/2018  . Chronic back pain 06/16/2018  . GERD (gastroesophageal reflux disease) 06/16/2018    Past Surgical History:  Procedure Laterality Date  . APPENDECTOMY          Home Medications    Prior to Admission medications   Medication Sig Start Date End Date Taking? Authorizing Provider  albuterol (PROVENTIL) (2.5 MG/3ML) 0.083% nebulizer solution Take 2.5 mg by nebulization every 6 (six) hours as needed for wheezing or shortness of breath.  04/15/18   [provider]  atorvastatin (LIPITOR) 20 MG tablet Take 20 mg by mouth daily.    [provider]  chlorproMAZINE (THORAZINE) 25 MG tablet Take 1 tablet (25 mg total) by mouth 3 (three) times daily as  needed for up to 3 days for hiccoughs. 06/20/18 06/23/18  Johnson, Clanford L, MD  gabapentin (NEURONTIN) 400 MG capsule Take 1 capsule by mouth 3 (three) times daily. 06/12/18   [provider]  HYDROcodone-acetaminophen (NORCO/VICODIN) 5-325 MG tablet Take 1 tablet by mouth every 6 (six) hours as needed. 06/12/18   [provider]  metoprolol tartrate (LOPRESSOR) 25 MG tablet Take 0.5 tablets (12.5 mg total) by mouth 2 (two) times daily. 06/20/18 07/20/18  Murlean Iba, MD  Promethazine-DM 6.25-15 MG/5ML SOLN  05/19/18   [provider]  VENTOLIN HFA 108 [phone] Base) MCG/ACT inhaler  05/27/18   [provider]    Family History Family History  Problem Relation Age of Onset  . Diabetes Mother   . Hypertension Father     Social History Social History   Tobacco Use  . Smoking status: Never Smoker  . Smokeless tobacco: Never Used  Substance Use Topics  . Alcohol use: Yes    Alcohol/week: 3.0 standard drinks    Types: 3 Cans of beer per week    Comment: 3 cans beer/ week  . Drug use: Never     Allergies   Keflex [cephalexin]   Review of Systems Review of Systems  Constitutional: Positive for fever.  HENT: Negative for sore throat.   Eyes: Negative for redness.  Respiratory: Positive for cough and shortness of  breath.   Cardiovascular: Negative for chest pain.  Gastrointestinal: Negative for abdominal pain, diarrhea and vomiting.  Endocrine: Negative for polyuria.  Genitourinary: Negative for flank pain.  Musculoskeletal: Negative for back pain and neck pain.  Skin: Negative for rash.  Neurological: Negative for headaches.  Hematological: Does not bruise/bleed easily.  Psychiatric/Behavioral: Negative for confusion.     Physical Exam Updated Vital Signs BP 112/76   Pulse (!) 116   Temp 98.4 F (36.9 C) (Oral)   Resp (!) 28   Wt 66.7 kg   SpO2 100%   BMI 20.50 kg/m   Physical Exam Vitals signs and nursing note reviewed.   Constitutional:      Appearance: Normal appearance. He is well-developed.  HENT:     Head: Atraumatic.     Nose: Nose normal.     Mouth/Throat:     Mouth: Mucous membranes are moist.     Pharynx: Oropharynx is clear.  Eyes:     General: No scleral icterus.    Conjunctiva/sclera: Conjunctivae normal.  Neck:     Musculoskeletal: Normal range of motion and neck supple. No neck rigidity.     Trachea: No tracheal deviation.     Comments: No stiffness or rigidity.  Cardiovascular:     Rate and Rhythm: Normal rate and regular rhythm.     Pulses: Normal pulses.     Heart sounds: Normal heart sounds. No murmur. No friction rub. No gallop.   Pulmonary:     Effort: Pulmonary effort is normal. No accessory muscle usage or respiratory distress.     Breath sounds: Wheezing present.     Comments: Rhonchi left.  Abdominal:     General: Bowel sounds are normal. There is no distension.     Palpations: Abdomen is soft.     Tenderness: There is no abdominal tenderness. There is no guarding.  Genitourinary:    Comments: No cva tenderness. Musculoskeletal:        General: No swelling or tenderness.     Right lower leg: No edema.     Left lower leg: No edema.  Skin:    General: Skin is warm and dry.     Findings: No rash.  Neurological:     Mental Status: He is alert.     Comments: Alert, speech clear.   Psychiatric:        Mood and Affect: Mood normal.      ED Treatments / Results  Labs (all labs ordered are listed, but only abnormal results are displayed) Results for orders placed or performed during the hospital encounter of 01/28/19  SARS Coronavirus 2 (CEPHEID- Performed in [place] hospital lab), [facility] Order  Result Value Ref Range   SARS Coronavirus 2 NEGATIVE NEGATIVE  Comprehensive metabolic panel  Result Value Ref Range   Sodium 136 135 - 145 mmol/L   Potassium 3.9 3.5 - 5.1 mmol/L   Chloride 102 98 - 111 mmol/L   CO2 22 22 - 32 mmol/L   Glucose, Bld 104 (H) 70 - 99  mg/dL   BUN 19 8 - 23 mg/dL   Creatinine, Ser 1.14 0.61 - 1.24 mg/dL   Calcium 8.6 (L) 8.9 - 10.3 mg/dL   Total Protein 6.9 6.5 - 8.1 g/dL   Albumin 2.4 (L) 3.5 - 5.0 g/dL   AST 17 15 - 41 U/L   ALT 14 0 - 44 U/L   Alkaline Phosphatase 104 38 - 126 U/L   Total Bilirubin 0.9 0.3 - 1.2  mg/dL   GFR calc non Af Amer >60 >60 mL/min   GFR calc Af Amer >60 >60 mL/min   Anion gap 12 5 - 15  Lactic acid, plasma  Result Value Ref Range   Lactic Acid, Venous 2.3 (HH) 0.5 - 1.9 mmol/L  Lactic acid, plasma  Result Value Ref Range   Lactic Acid, Venous 1.2 0.5 - 1.9 mmol/L  CBC with Differential  Result Value Ref Range   WBC 17.0 (H) 4.0 - 10.5 K/uL   RBC 3.47 (L) 4.22 - 5.81 MIL/uL   Hemoglobin 9.5 (L) 13.0 - 17.0 g/dL   HCT 30.3 (L) 39.0 - 52.0 %   MCV 87.3 80.0 - 100.0 fL   MCH 27.4 26.0 - 34.0 pg   MCHC 31.4 30.0 - 36.0 g/dL   RDW 14.8 11.5 - 15.5 %   Platelets 465 (H) 150 - 400 K/uL   nRBC 0.0 0.0 - 0.2 %   Neutrophils Relative % 86 %   Neutro Abs 14.6 (H) 1.7 - 7.7 K/uL   Lymphocytes Relative 5 %   Lymphs Abs 0.8 0.7 - 4.0 K/uL   Monocytes Relative 8 %   Monocytes Absolute 1.3 (H) 0.1 - 1.0 K/uL   Eosinophils Relative 0 %   Eosinophils Absolute 0.1 0.0 - 0.5 K/uL   Basophils Relative 0 %   Basophils Absolute 0.1 0.0 - 0.1 K/uL   Immature Granulocytes 1 %   Abs Immature Granulocytes 0.14 (H) 0.00 - 0.07 K/uL  Protime-INR  Result Value Ref Range   Prothrombin Time 17.0 (H) 11.4 - 15.2 seconds   INR 1.4 (H) 0.8 - 1.2  Urinalysis, Routine w reflex microscopic  Result Value Ref Range   Color, Urine AMBER (A) YELLOW   APPearance CLEAR CLEAR   Specific Gravity, Urine 1.018 1.005 - 1.030   pH 5.0 5.0 - 8.0   Glucose, UA NEGATIVE NEGATIVE mg/dL   Hgb urine dipstick SMALL (A) NEGATIVE   Bilirubin Urine NEGATIVE NEGATIVE   Ketones, ur 20 (A) NEGATIVE mg/dL   Protein, ur NEGATIVE NEGATIVE mg/dL   Nitrite NEGATIVE NEGATIVE   Leukocytes,Ua NEGATIVE NEGATIVE   RBC / HPF 0-5 0 - 5  RBC/hpf   WBC, UA 0-5 0 - 5 WBC/hpf   Bacteria, UA NONE SEEN NONE SEEN   Mucus PRESENT   Brain natriuretic peptide  Result Value Ref Range   B Natriuretic Peptide 77.0 0.0 - 100.0 pg/mL  Troponin I - ONCE - STAT  Result Value Ref Range   Troponin I <0.03 <0.03 ng/mL   Dg Chest Port 1 View  Result Date: 01/28/2019 CLINICAL DATA:  Cough, congestion EXAM: PORTABLE CHEST 1 VIEW COMPARISON:  None. FINDINGS: Heart is borderline in size. Vascular congestion. Peribronchial thickening. No confluent opacities, effusions or edema. No acute bony abnormality. IMPRESSION: Borderline cardiomegaly with vascular congestion. Peribronchial thickening may reflect bronchitis. Electronically Signed   By: Rolm Baptise M.D.   On: 01/28/2019 19:54    EKG EKG Interpretation  Date/Time:  Wednesday Jan 28 2019 19:10:57 EDT Ventricular Rate:  111 PR Interval:    QRS Duration: 83 QT Interval:  315 QTC Calculation: 428 R Axis:   80 Text Interpretation:  Sinus tachycardia Baseline wander Poor data quality No previous tracing Confirmed by Lajean Saver [phone]) on 01/28/2019 7:22:26 PM   Radiology Dg Chest Port 1 View  Result Date: 01/28/2019 CLINICAL DATA:  Cough, congestion EXAM: PORTABLE CHEST 1 VIEW COMPARISON:  None. FINDINGS: Heart is borderline in size. Vascular congestion.  Peribronchial thickening. No confluent opacities, effusions or edema. No acute bony abnormality. IMPRESSION: Borderline cardiomegaly with vascular congestion. Peribronchial thickening may reflect bronchitis. Electronically Signed   By: Rolm Baptise M.D.   On: 01/28/2019 19:54    Procedures Procedures (including critical care time)  Medications Ordered in ED Medications  sodium chloride flush (NS) 0.9 % injection 3 mL (has no administration in time range)     Initial Impression / Assessment and Plan / ED Course  I have reviewed the triage vital signs and the nursing notes.  Pertinent labs & imaging results that were available  during my care of the patient were reviewed by me and considered in my medical decision making (see chart for details).  Iv ns. Cultures. Cxr. Labs.   Reviewed nursing notes and prior charts for additional history.   Labs reviewed by me - wbc elev.   CXR reviewed by me - bronchitis changes, no def pna.   Given lung exam, cough, subj fever, elev wbc, will tx for suspected pna.  Allergy to ceph. levaquin iv.   albut mdi, atrovent mdi tx.   Additional labs reviewed - covid neg.   Evan Price was evaluated in Emergency Department on 01/28/2019 for the symptoms described in the history of present illness. He was evaluated in the context of the global COVID-19 pandemic, which necessitated consideration that the patient might be at risk for infection with the SARS-CoV-2 virus that causes COVID-19. Institutional protocols and algorithms that pertain to the evaluation of patients at risk for COVID-19 are in a state of rapid change based on information released by regulatory bodies including the CDC and federal and state organizations. These policies and algorithms were followed during the patient's care in the ED.   bp low, 84/65 on recheck. Ivf.   Initial lactate elevated - iv ns boluses.   Repeat lactate improved.   Given lung exam, symptoms, elev wbc - suspect pna. Given tachy, elev lactate, soft bp, will admit.  hospitalists consulted for admission.      Final Clinical Impressions(s) / ED Diagnoses   Final diagnoses:  None    ED Discharge Orders    None       Lajean Saver, MD 01/28/19 2227

## 2019-01-28 NOTE — ED Notes (Signed)
Date and time results received: 01/28/19 2049   Test: Lactic Critical Value: 2.3  Name of Provider Notified: MD, Ashok Cordia

## 2019-01-29 ENCOUNTER — Other Ambulatory Visit: Payer: Self-pay

## 2019-01-29 ENCOUNTER — Inpatient Hospital Stay (HOSPITAL_COMMUNITY): Payer: Medicare Other

## 2019-01-29 DIAGNOSIS — I351 Nonrheumatic aortic (valve) insufficiency: Secondary | ICD-10-CM

## 2019-01-29 DIAGNOSIS — N182 Chronic kidney disease, stage 2 (mild): Secondary | ICD-10-CM

## 2019-01-29 DIAGNOSIS — E43 Unspecified severe protein-calorie malnutrition: Secondary | ICD-10-CM | POA: Diagnosis present

## 2019-01-29 DIAGNOSIS — D500 Iron deficiency anemia secondary to blood loss (chronic): Secondary | ICD-10-CM

## 2019-01-29 DIAGNOSIS — D509 Iron deficiency anemia, unspecified: Secondary | ICD-10-CM

## 2019-01-29 DIAGNOSIS — A419 Sepsis, unspecified organism: Secondary | ICD-10-CM

## 2019-01-29 LAB — COMPREHENSIVE METABOLIC PANEL
ALT: 12 U/L (ref 0–44)
AST: 13 U/L — ABNORMAL LOW (ref 15–41)
Albumin: 2 g/dL — ABNORMAL LOW (ref 3.5–5.0)
Alkaline Phosphatase: 89 U/L (ref 38–126)
Anion gap: 9 (ref 5–15)
BUN: 18 mg/dL (ref 8–23)
CO2: 21 mmol/L — ABNORMAL LOW (ref 22–32)
Calcium: 8.1 mg/dL — ABNORMAL LOW (ref 8.9–10.3)
Chloride: 107 mmol/L (ref 98–111)
Creatinine, Ser: 1.13 mg/dL (ref 0.61–1.24)
GFR calc Af Amer: >60 mL/min (ref >60)
GFR calc non Af Amer: >60 mL/min (ref >60)
Glucose, Bld: 104 mg/dL — ABNORMAL HIGH (ref 70–99)
Potassium: 3.9 mmol/L (ref 3.5–5.1)
Sodium: 137 mmol/L (ref 135–145)
Total Bilirubin: 0.9 mg/dL (ref 0.3–1.2)
Total Protein: 6.1 g/dL — ABNORMAL LOW (ref 6.5–8.1)

## 2019-01-29 LAB — RESPIRATORY PANEL BY PCR

## 2019-01-29 LAB — MRSA PCR SCREENING: MRSA by PCR: NEGATIVE

## 2019-01-29 LAB — CBC
HCT: 27.9 % — ABNORMAL LOW (ref 39.0–52.0)
Hemoglobin: 8.5 g/dL — ABNORMAL LOW (ref 13.0–17.0)
MCH: 26.8 pg (ref 26.0–34.0)
MCHC: 30.5 g/dL (ref 30.0–36.0)
MCV: 88 fL (ref 80.0–100.0)
Platelets: 390 10*3/uL (ref 150–400)
RBC: 3.17 MIL/uL — ABNORMAL LOW (ref 4.22–5.81)
RDW: 14.8 % (ref 11.5–15.5)
WBC: 14.7 10*3/uL — ABNORMAL HIGH (ref 4.0–10.5)
nRBC: 0 % (ref 0.0–0.2)

## 2019-01-29 LAB — INFLUENZA PANEL BY PCR (TYPE A & B)
Influenza A By PCR: NEGATIVE
Influenza B By PCR: NEGATIVE

## 2019-01-29 LAB — ECHOCARDIOGRAM LIMITED
Height: 71 in
Weight: 2426.82 oz

## 2019-01-29 LAB — STREP PNEUMONIAE URINARY ANTIGEN: Strep Pneumo Urinary Antigen: NEGATIVE

## 2019-01-29 LAB — IRON AND TIBC
Iron: 10 ug/dL — ABNORMAL LOW (ref 45–182)
Saturation Ratios: 5 % — ABNORMAL LOW (ref 17.9–39.5)
TIBC: 183 ug/dL — ABNORMAL LOW (ref 250–450)
UIBC: 173 ug/dL

## 2019-01-29 LAB — PROCALCITONIN: Procalcitonin: 0.25 ng/mL

## 2019-01-29 LAB — TSH: TSH: 0.223 u[IU]/mL — ABNORMAL LOW (ref 0.350–4.500)

## 2019-01-29 LAB — VITAMIN B12: Vitamin B-12: 119 pg/mL — ABNORMAL LOW (ref 180–914)

## 2019-01-29 LAB — FERRITIN: Ferritin: 340 ng/mL — ABNORMAL HIGH (ref 24–336)

## 2019-01-29 LAB — SEDIMENTATION RATE: Sed Rate: 105 mm/hr — ABNORMAL HIGH (ref 0–16)

## 2019-01-29 MED ORDER — GABAPENTIN 400 MG PO CAPS
400.0000 mg | ORAL_CAPSULE | Freq: Three times a day (TID) | ORAL | Status: DC
  Administered 2019-01-29 – 2019-02-03 (×17): 400 mg via ORAL
  Filled 2019-01-29 (×17): qty 1

## 2019-01-29 MED ORDER — ALBUTEROL SULFATE (2.5 MG/3ML) 0.083% IN NEBU: 2.5000 mg | INHALATION_SOLUTION | Freq: Four times a day (QID) | RESPIRATORY_TRACT | Status: DC | PRN

## 2019-01-29 MED ORDER — LEVOFLOXACIN IN D5W 750 MG/150ML IV SOLN
750.0000 mg | INTRAVENOUS | Status: AC
  Administered 2019-01-29 – 2019-02-02 (×5): 750 mg via INTRAVENOUS
  Filled 2019-01-29 (×5): qty 150

## 2019-01-29 MED ORDER — IOHEXOL 350 MG/ML SOLN
100.0000 mL | Freq: Once | INTRAVENOUS | Status: AC | PRN
  Administered 2019-01-29: 100 mL via INTRAVENOUS

## 2019-01-29 MED ORDER — ALBUTEROL SULFATE (2.5 MG/3ML) 0.083% IN NEBU: 5.0000 mg | INHALATION_SOLUTION | Freq: Four times a day (QID) | RESPIRATORY_TRACT | Status: DC | PRN

## 2019-01-29 MED ORDER — LOPERAMIDE HCL 2 MG PO CAPS
2.0000 mg | ORAL_CAPSULE | Freq: Two times a day (BID) | ORAL | Status: DC | PRN
  Administered 2019-01-29: 2 mg via ORAL
  Filled 2019-01-29: qty 1

## 2019-01-29 MED ORDER — ACETAMINOPHEN 325 MG PO TABS
650.0000 mg | ORAL_TABLET | ORAL | Status: DC | PRN
  Administered 2019-01-29 – 2019-01-31 (×2): 650 mg via ORAL
  Filled 2019-01-29 (×2): qty 2

## 2019-01-29 MED ORDER — SODIUM CHLORIDE 0.9 % IV SOLN
INTRAVENOUS | Status: AC
  Administered 2019-01-29: 01:00:00 via INTRAVENOUS

## 2019-01-29 MED ORDER — ENOXAPARIN SODIUM 80 MG/0.8ML ~~LOC~~ SOLN
1.0000 mg/kg | Freq: Once | SUBCUTANEOUS | Status: AC
  Administered 2019-01-29: 70 mg via SUBCUTANEOUS
  Filled 2019-01-29: qty 0.8

## 2019-01-29 MED ORDER — VITAMIN B-12 100 MCG PO TABS
500.0000 ug | ORAL_TABLET | Freq: Every day | ORAL | Status: DC
  Administered 2019-01-29 – 2019-02-03 (×6): 500 ug via ORAL
  Filled 2019-01-29: qty 5
  Filled 2019-01-29 (×5): qty 1

## 2019-01-29 MED ORDER — VANCOMYCIN HCL 10 G IV SOLR
1500.0000 mg | Freq: Once | INTRAVENOUS | Status: AC
  Administered 2019-01-29: 1500 mg via INTRAVENOUS
  Filled 2019-01-29 (×2): qty 1500

## 2019-01-29 MED ORDER — VANCOMYCIN HCL 500 MG IV SOLR
500.0000 mg | Freq: Two times a day (BID) | INTRAVENOUS | Status: DC
  Filled 2019-01-29: qty 500

## 2019-01-29 MED ORDER — GUAIFENESIN 100 MG/5ML PO SOLN: 5.0000 mL | ORAL | Status: DC | PRN

## 2019-01-29 MED ORDER — ENOXAPARIN SODIUM 40 MG/0.4ML ~~LOC~~ SOLN: 40.0000 mg | SUBCUTANEOUS | Status: DC

## 2019-01-29 MED ORDER — HYDROCODONE-ACETAMINOPHEN 5-325 MG PO TABS
1.0000 | ORAL_TABLET | Freq: Four times a day (QID) | ORAL | Status: DC | PRN
  Administered 2019-01-29 – 2019-02-03 (×14): 1 via ORAL
  Filled 2019-01-29 (×14): qty 1

## 2019-01-29 MED ORDER — PRO-STAT SUGAR FREE PO LIQD
30.0000 mL | Freq: Two times a day (BID) | ORAL | Status: DC
  Administered 2019-01-29 – 2019-02-03 (×8): 30 mL via ORAL
  Filled 2019-01-29 (×9): qty 30

## 2019-01-29 NOTE — Progress Notes (Signed)
ANTIBIOTIC CONSULT NOTE-Preliminary  Pharmacy Consult for vancomycin Indication: sepsis  Allergies  Allergen Reactions  . Keflex [Cephalexin] Other (See Comments)    unknown    Patient Measurements: Height: 5\' 11"  (180.3 cm) Weight: 152 lb 1.9 oz (69 kg) IBW/kg (Calculated) : 75.3 Adjusted Body Weight:   Vital Signs: Temp: 98.2 F (36.8 C) (05/07 0013) Temp Source: Oral (05/07 0013) BP: 120/67 (05/06 2348) Pulse Rate: 93 (05/06 2348)  Labs: Recent Labs    01/28/19 1949  WBC 17.0*  HGB 9.5*  PLT 465*  CREATININE 1.14    Estimated Creatinine Clearance: 56.3 mL/min (by C-G formula based on SCr of 1.14 mg/dL).  No results for input(s): VANCOTROUGH, VANCOPEAK, VANCORANDOM, GENTTROUGH, GENTPEAK, GENTRANDOM, TOBRATROUGH, TOBRAPEAK, TOBRARND, AMIKACINPEAK, AMIKACINTROU, AMIKACIN in the last 72 hours.   Microbiology: Recent Results (from the past 720 hour(s))  SARS Coronavirus 2 (CEPHEID- Performed in [place] hospital lab), Hosp Order     Status: None   Collection Time: 01/28/19  7:35 PM  Result Value Ref Range Status   SARS Coronavirus 2 NEGATIVE NEGATIVE Final    Comment: (NOTE) If result is NEGATIVE SARS-CoV-2 target nucleic acids are NOT DETECTED. The SARS-CoV-2 RNA is generally detectable in upper and lower  respiratory specimens during the acute phase of infection. The lowest  concentration of SARS-CoV-2 viral copies this assay can detect is 250  copies / mL. A negative result does not preclude SARS-CoV-2 infection  and should not be used as the sole basis for treatment or other  patient management decisions.  A negative result may occur with  improper specimen collection / handling, submission of specimen other  than nasopharyngeal swab, presence of viral mutation(s) within the  areas targeted by this assay, and inadequate number of viral copies  (<250 copies / mL). A negative result must be combined with clinical  observations, patient history, and  epidemiological information. If result is POSITIVE SARS-CoV-2 target nucleic acids are DETECTED. The SARS-CoV-2 RNA is generally detectable in upper and lower  respiratory specimens dur ing the acute phase of infection.  Positive  results are indicative of active infection with SARS-CoV-2.  Clinical  correlation with patient history and other diagnostic information is  necessary to determine patient infection status.  Positive results do  not rule out bacterial infection or co-infection with other viruses. If result is PRESUMPTIVE POSTIVE SARS-CoV-2 nucleic acids MAY BE PRESENT.   A presumptive positive result was obtained on the submitted specimen  and confirmed on repeat testing.  While 2019 novel coronavirus  (SARS-CoV-2) nucleic acids may be present in the submitted sample  additional confirmatory testing may be necessary for epidemiological  and / or clinical management purposes  to differentiate between  SARS-CoV-2 and other Sarbecovirus currently known to infect humans.  If clinically indicated additional testing with an alternate test  methodology [phone]) is advised. The SARS-CoV-2 RNA is generally  detectable in upper and lower respiratory sp ecimens during the acute  phase of infection. The expected result is Negative. Fact Sheet for Patients:  StrictlyIdeas.no Fact Sheet for Healthcare Providers: BankingDealers.co.za This test is not yet approved or cleared by the [place] FDA and has been authorized for detection and/or diagnosis of SARS-CoV-2 by FDA under an Emergency Use Authorization (EUA).  This EUA will remain in effect (meaning this test can be used) for the duration of the COVID-19 declaration under Section 564(b)(1) of the Act, 21 U.S.C. section 360bbb-3(b)(1), unless the authorization is terminated or revoked sooner. Performed at [facility]  [facility], [address] [place], [place] [address]     Medical  History: Past Medical History:  Diagnosis Date  . Asthma   . Chronic back pain 2015  . Dyslipidemia   . GERD (gastroesophageal reflux disease)   . Pneumonia     Medications:  Scheduled:  . feeding supplement (PRO-STAT SUGAR FREE 64)  30 mL Oral BID  . gabapentin  400 mg Oral TID  . ipratropium  2 puff Inhalation Once   Infusions:  . sodium chloride 75 mL/hr at 01/29/19 0042  . levofloxacin (LEVAQUIN) IV      Assessment: 73 yo male admitted for cough and dyspnea.  Covid negative.  Starting vancomycin for presumed sepsis.   Goal of Therapy:  Vancomycin trough level 15-20 mcg/ml  Plan:  Preliminary review of pertinent patient information completed.  Protocol will be initiated with dose(s) of vancomycin 20mg /kg = 1500 mg IV x 1.  Forestine Na clinical pharmacist will complete review during morning rounds to assess patient and finalize treatment regimen if needed.  Nena Hampe Scarlett, RPH 01/29/2019,3:33 AM

## 2019-01-29 NOTE — Progress Notes (Signed)
Patient ID: Evan Price, male   DOB: 03-22-1946, 74 y.o.   MRN: [id number]  Xcover CTA chest not able to be done tonight,  DC Dvt prophylaxis lovenox, and give lovenox 1mg /kg [place] x1, verbal order given to RN

## 2019-01-29 NOTE — Plan of Care (Signed)
Antibiotic Therapy

## 2019-01-29 NOTE — Progress Notes (Signed)
Xcover Diarrhea per RN ? Check stools for C. Diff, Gi pathogen panel Contact/Enteric precautions Imodium 2mg  po bid prn

## 2019-01-29 NOTE — Progress Notes (Signed)
CTA chest reviewed  LUL cavitary lesion -clinically suspect necrotizing Pna vs malignancy -less likely MTB, but cannot rule out nontuberculous mycobacteria -Quantiferon TB ordered -pulmonary consult in am -airborne isolation with sputum for AFB x 3 -pt updated on findings  DTat

## 2019-01-29 NOTE — Progress Notes (Signed)
PROGRESS NOTE  Evan Price KPV:[id number] DOB: 17-Feb-1946 DOA: 01/28/2019 PCP: Donalynn Furlong, MD  Brief History:  73 year old male with history of asthma, chronic back pain, anemia, and GERD presenting with 2-week history of fatigue and malaise.  On 01/25/2019, the patient developed a cough and some shortness of breath with worsening fatigue.  His cough was mostly nonproductive, but he would occasionally produce some yellow mucus.  He denied hemoptysis.  He denies any recent travels or sick contacts.  He had some nausea without any emesis or diarrhea.  He has had poor oral intake.  The patient has never smoked.  He denied any chest pain, abdominal pain, dysuria, hematuria, sore throat.  He did have an occasional frontal headache.  He denied any rashes or synovitis.  Because of his symptoms, the patient went to the emergency department at [facility].  At that time, the patient was noted to have WBC 18.0 with serum creatinine 1.20.  The patient was told that he had pneumonia.  He was given levofloxacin.  There was an attempt to transfer to [facility] long hospital to rule out novel coronavirus infection., but the patient wanted to come to [facility].  Nevertheless, the patient was discharged from the ED.  His family drove him subsequently to [facility] ED.   In the emergency department, the patient had a low-grade temperature nine 9.2 with blood pressure of 84/65.  The patient was fluid resuscitated.  WBC was 17.0 with lactic acid 2.3.  Chest x-ray showed peribronchial thickening without consolidation.  He was started on levofloxacin and admitted for further evaluation.  Assessment/Plan: Sepsis -Suspect pulmonary source -Lactic acid peaked 2.3 -Follow blood cultures although these are likely going to be negative as the patient received levofloxacin from the ED in [place] prior to coming to [facility] -Urinalysis negative for pyuria -Check procalcitonin -Influenza PCR -Viral respiratory panel -Urine Legionella  antigen -Urine Streptococcus pneumoniae antigen -continue IVF  Acute bronchitis -Personally reviewed chest x-ray--peribronchial thickening without consolidation -Influenza PCR -Viral respiratory panel  Iron deficiency anemia -Holding on repletion of iron until etiology of infectious process clarified  B12 deficiency -Start supplementation  Elevated d-dimer  -CT angiogram chest -personally reviewed EKG--sinus tachycardia, no STT changes  Asthma -Stable without wheezing -Albuterol nebulizer as needed shortness of breath  Chronic back pain -Continue home dose gabapentin and hydrocodone  CKD stage II -Baseline creatinine 1.0-1.3       Disposition Plan:   Home in 2-3 days  Family Communication:   No Family at bedside  Consultants:  none  Code Status:  FULL   DVT Prophylaxis:   [place] Lovenox   Procedures: As Listed in Progress Note Above  Antibiotics: levoflox 5/6>>> vanco 5/7 x 1       Subjective: The patient feels a little better.  Patient denies fevers, chills, headache, chest pain, dyspnea, nausea, vomiting, diarrhea, abdominal pain, dysuria, hematuria, hematochezia, and melena.   Objective: Vitals:   01/29/19 0400 01/29/19 0500 01/29/19 0600 01/29/19 0700  BP: 115/61 97/60 (!) 111/57 101/65  Pulse: 82 81 83 76  Resp: 14 11 (!) 22 14  Temp: 97.8 F (36.6 C)     TempSrc: Oral     SpO2: 100% 96% 97% 97%  Weight: 68.8 kg     Height:        Intake/Output Summary (Last 24 hours) at 01/29/2019 0755 Last data filed at 01/29/2019 0600 Gross per 24 hour  Intake 1650 ml  Output 500  ml  Net 1150 ml   Weight change:  Exam:   General:  Pt is alert, follows commands appropriately, not in acute distress  HEENT: No icterus, No thrush, No neck mass, [place]/AT  Cardiovascular: RRR, S1/S2, no rubs, no gallops  Respiratory: R-basilar rales. No wheeze. Left CTA  Abdomen: Soft/+BS, non tender, non distended, no guarding  Extremities: No edema, No  lymphangitis, No petechiae, No rashes, no synovitis   Data Reviewed: I have personally reviewed following labs and imaging studies Basic Metabolic Panel: Recent Labs  Lab 01/28/19 1949 01/29/19 0426  NA 136 137  K 3.9 3.9  CL 102 107  CO2 22 21*  GLUCOSE 104* 104*  BUN 19 18  CREATININE 1.14 1.13  CALCIUM 8.6* 8.1*   Liver Function Tests: Recent Labs  Lab 01/28/19 1949 01/29/19 0426  AST 17 13*  ALT 14 12  ALKPHOS 104 89  BILITOT 0.9 0.9  PROT 6.9 6.1*  ALBUMIN 2.4* 2.0*   No results for input(s): LIPASE, AMYLASE in the last 168 hours. No results for input(s): AMMONIA in the last 168 hours. Coagulation Profile: Recent Labs  Lab 01/28/19 1949  INR 1.4*   CBC: Recent Labs  Lab 01/28/19 1949 01/29/19 0426  WBC 17.0* 14.7*  NEUTROABS 14.6*  --   HGB 9.5* 8.5*  HCT 30.3* 27.9*  MCV 87.3 88.0  PLT 465* 390   Cardiac Enzymes: Recent Labs  Lab 01/28/19 1949  TROPONINI <0.03   BNP: Invalid input(s): POCBNP CBG: No results for input(s): GLUCAP in the last 168 hours. HbA1C: No results for input(s): HGBA1C in the last 72 hours. Urine analysis:    Component Value Date/Time   COLORURINE AMBER (A) 01/28/2019 1845   APPEARANCEUR CLEAR 01/28/2019 1845   LABSPEC 1.018 01/28/2019 1845   PHURINE 5.0 01/28/2019 1845   GLUCOSEU NEGATIVE 01/28/2019 1845   HGBUR SMALL (A) 01/28/2019 1845   BILIRUBINUR NEGATIVE 01/28/2019 1845   KETONESUR 20 (A) 01/28/2019 1845   PROTEINUR NEGATIVE 01/28/2019 1845   NITRITE NEGATIVE 01/28/2019 1845   LEUKOCYTESUR NEGATIVE 01/28/2019 1845   Sepsis Labs: @LABRCNTIP (procalcitonin:4,lacticidven:4) ) Recent Results (from the past 240 hour(s))  SARS Coronavirus 2 (CEPHEID- Performed in [place] hospital lab), Hosp Order     Status: None   Collection Time: 01/28/19  7:35 PM  Result Value Ref Range Status   SARS Coronavirus 2 NEGATIVE NEGATIVE Final    Comment: (NOTE) If result is NEGATIVE SARS-CoV-2 target nucleic acids are  NOT DETECTED. The SARS-CoV-2 RNA is generally detectable in upper and lower  respiratory specimens during the acute phase of infection. The lowest  concentration of SARS-CoV-2 viral copies this assay can detect is 250  copies / mL. A negative result does not preclude SARS-CoV-2 infection  and should not be used as the sole basis for treatment or other  patient management decisions.  A negative result may occur with  improper specimen collection / handling, submission of specimen other  than nasopharyngeal swab, presence of viral mutation(s) within the  areas targeted by this assay, and inadequate number of viral copies  (<250 copies / mL). A negative result must be combined with clinical  observations, patient history, and epidemiological information. If result is POSITIVE SARS-CoV-2 target nucleic acids are DETECTED. The SARS-CoV-2 RNA is generally detectable in upper and lower  respiratory specimens dur ing the acute phase of infection.  Positive  results are indicative of active infection with SARS-CoV-2.  Clinical  correlation with patient history and other diagnostic information  is  necessary to determine patient infection status.  Positive results do  not rule out bacterial infection or co-infection with other viruses. If result is PRESUMPTIVE POSTIVE SARS-CoV-2 nucleic acids MAY BE PRESENT.   A presumptive positive result was obtained on the submitted specimen  and confirmed on repeat testing.  While 2019 novel coronavirus  (SARS-CoV-2) nucleic acids may be present in the submitted sample  additional confirmatory testing may be necessary for epidemiological  and / or clinical management purposes  to differentiate between  SARS-CoV-2 and other Sarbecovirus currently known to infect humans.  If clinically indicated additional testing with an alternate test  methodology [phone]) is advised. The SARS-CoV-2 RNA is generally  detectable in upper and lower respiratory sp ecimens  during the acute  phase of infection. The expected result is Negative. Fact Sheet for Patients:  StrictlyIdeas.no Fact Sheet for Healthcare Providers: BankingDealers.co.za This test is not yet approved or cleared by the [place] FDA and has been authorized for detection and/or diagnosis of SARS-CoV-2 by FDA under an Emergency Use Authorization (EUA).  This EUA will remain in effect (meaning this test can be used) for the duration of the COVID-19 declaration under Section 564(b)(1) of the Act, 21 U.S.C. section 360bbb-3(b)(1), unless the authorization is terminated or revoked sooner. Performed at [facility], [address], [place], [place] [address]   Culture, blood (Routine x 2)     Status: None (Preliminary result)   Collection Time: 01/28/19  7:50 PM  Result Value Ref Range Status   Specimen Description BLOOD BLOOD RIGHT WRIST  Final   Special Requests   Final    BOTTLES DRAWN AEROBIC AND ANAEROBIC Blood Culture adequate volume   Culture   Final    NO GROWTH < 12 HOURS Performed at [facility], [address], [place], [place] [address]    Report Status PENDING  Incomplete  Culture, blood (Routine x 2)     Status: None (Preliminary result)   Collection Time: 01/28/19  8:00 PM  Result Value Ref Range Status   Specimen Description BLOOD SITE NOT SPECIFIED  Final   Special Requests   Final    BOTTLES DRAWN AEROBIC AND ANAEROBIC Blood Culture adequate volume   Culture   Final    NO GROWTH < 12 HOURS Performed at [facility], [address] [place], [place] [address]    Report Status PENDING  Incomplete  MRSA PCR Screening     Status: None   Collection Time: 01/29/19 12:01 AM  Result Value Ref Range Status   MRSA by PCR NEGATIVE NEGATIVE Final    Comment:        The GeneXpert MRSA Assay (FDA approved for NASAL specimens only), is one component of a comprehensive MRSA colonization surveillance program. It is not intended  to diagnose MRSA infection nor to guide or monitor treatment for MRSA infections. Performed at [facility], [address], [place], [place] [address]      Scheduled Meds: . feeding supplement (PRO-STAT SUGAR FREE 64)  30 mL Oral BID  . gabapentin  400 mg Oral TID  . ipratropium  2 puff Inhalation Once   Continuous Infusions: . sodium chloride 75 mL/hr at 01/29/19 0042  . levofloxacin (LEVAQUIN) IV      Procedures/Studies: Dg Chest Port 1 View  Result Date: 01/28/2019 CLINICAL DATA:  Cough, congestion EXAM: PORTABLE CHEST 1 VIEW COMPARISON:  None. FINDINGS: Heart is borderline in size. Vascular congestion. Peribronchial thickening. No confluent opacities, effusions or edema.  No acute bony abnormality. IMPRESSION: Borderline cardiomegaly with vascular congestion. Peribronchial thickening may reflect bronchitis. Electronically Signed   By: Rolm Baptise M.D.   On: 01/28/2019 19:54    Orson Eva, DO  [facility] Pager [phone]  If 7PM-7AM, please contact night-coverage [email or web address] Password [email or web address] 01/29/2019, 7:55 AM   LOS: 1 day

## 2019-01-29 NOTE — Progress Notes (Signed)
*  PRELIMINARY RESULTS* Echocardiogram 2D Echocardiogram LIMITED has been performed.  Leavy Cella 01/29/2019, 4:04 PM

## 2019-01-29 NOTE — Progress Notes (Signed)
Pharmacy Antibiotic Note  Evan Price is a 73 y.o. male admitted on 01/28/2019 with sepsis.  Pharmacy has been consulted for vancomycin dosing.  Plan: Vancomycin 500mg \ IV every 12 hours.  Goal trough 15-20 mcg/mL.  Height: 5\' 11"  (180.3 cm) Weight: 151 lb 10.8 oz (68.8 kg) IBW/kg (Calculated) : 75.3  Temp (24hrs), Avg:98.4 F (36.9 C), Min:97.8 F (36.6 C), Max:99.2 F (37.3 C)  Recent Labs  Lab 01/28/19 1949 01/28/19 2140 01/29/19 0426  WBC 17.0*  --  14.7*  CREATININE 1.14  --  1.13  LATICACIDVEN 2.3* 1.2  --     Estimated Creatinine Clearance: 56.7 mL/min (by C-G formula based on SCr of 1.13 mg/dL).    Allergies  Allergen Reactions  . Keflex [Cephalexin] Other (See Comments)    unknown    Antimicrobials this admission: 5/7 vancomycin >>  5/7 levofloxacin >>   Microbiology results: 5/7 BCx: sent 5/7 UCx: sent  5/7 Cdiff: sent   5/7 Covid 19: Negative   Thank you for allowing pharmacy to be a part of this patient's care.  Donna Christen Mirelle Biskup 01/29/2019 7:06 AM

## 2019-01-30 DIAGNOSIS — D508 Other iron deficiency anemias: Secondary | ICD-10-CM

## 2019-01-30 DIAGNOSIS — J181 Lobar pneumonia, unspecified organism: Secondary | ICD-10-CM

## 2019-01-30 LAB — BASIC METABOLIC PANEL
Anion gap: 7 (ref 5–15)
BUN: 15 mg/dL (ref 8–23)
CO2: 23 mmol/L (ref 22–32)
Calcium: 8.4 mg/dL — ABNORMAL LOW (ref 8.9–10.3)
Chloride: 108 mmol/L (ref 98–111)
Creatinine, Ser: 0.98 mg/dL (ref 0.61–1.24)
GFR calc Af Amer: >60 mL/min (ref >60)
GFR calc non Af Amer: >60 mL/min (ref >60)
Glucose, Bld: 102 mg/dL — ABNORMAL HIGH (ref 70–99)
Potassium: 3.6 mmol/L (ref 3.5–5.1)
Sodium: 138 mmol/L (ref 135–145)

## 2019-01-30 LAB — CBC
HCT: 28.5 % — ABNORMAL LOW (ref 39.0–52.0)
Hemoglobin: 8.6 g/dL — ABNORMAL LOW (ref 13.0–17.0)
MCH: 27.2 pg (ref 26.0–34.0)
MCHC: 30.2 g/dL (ref 30.0–36.0)
MCV: 90.2 fL (ref 80.0–100.0)
Platelets: 371 10*3/uL (ref 150–400)
RBC: 3.16 MIL/uL — ABNORMAL LOW (ref 4.22–5.81)
RDW: 14.8 % (ref 11.5–15.5)
WBC: 10.6 10*3/uL — ABNORMAL HIGH (ref 4.0–10.5)
nRBC: 0 % (ref 0.0–0.2)

## 2019-01-30 LAB — FOLATE RBC
Folate, Hemolysate: 285 ng/mL
Folate, RBC: 1113 ng/mL (ref >498)
Hematocrit: 25.6 % — ABNORMAL LOW (ref 37.5–51.0)

## 2019-01-30 LAB — TROPONIN I
Troponin I: <0.03 ng/mL (ref <0.03)
Troponin I: <0.03 ng/mL (ref <0.03)
Troponin I: <0.03 ng/mL (ref <0.03)

## 2019-01-30 LAB — PROCALCITONIN: Procalcitonin: 0.15 ng/mL

## 2019-01-30 LAB — HIV ANTIBODY (ROUTINE TESTING W REFLEX): HIV Screen 4th Generation wRfx: NONREACTIVE

## 2019-01-30 MED ORDER — SODIUM CHLORIDE 3 % IN NEBU
4.0000 mL | INHALATION_SOLUTION | RESPIRATORY_TRACT | Status: AC | PRN
  Administered 2019-01-30 – 2019-02-01 (×3): 4 mL via RESPIRATORY_TRACT
  Filled 2019-01-30 (×3): qty 4

## 2019-01-30 MED ORDER — SODIUM CHLORIDE 0.9% FLUSH
10.0000 mL | Freq: Two times a day (BID) | INTRAVENOUS | Status: DC
  Administered 2019-01-31 – 2019-02-03 (×6): 10 mL

## 2019-01-30 MED ORDER — CHLORHEXIDINE GLUCONATE CLOTH 2 % EX PADS
6.0000 | MEDICATED_PAD | Freq: Every day | CUTANEOUS | Status: DC
  Administered 2019-01-30 – 2019-02-03 (×5): 6 via TOPICAL

## 2019-01-30 MED ORDER — CLINDAMYCIN PHOSPHATE 900 MG/50ML IV SOLN: 900.0000 mg | Freq: Three times a day (TID) | INTRAVENOUS | Status: DC

## 2019-01-30 MED ORDER — SODIUM CHLORIDE 0.9% FLUSH: 10.0000 mL | INTRAVENOUS | Status: DC | PRN

## 2019-01-30 MED ORDER — CLINDAMYCIN PHOSPHATE 600 MG/50ML IV SOLN
600.0000 mg | Freq: Three times a day (TID) | INTRAVENOUS | Status: DC
  Administered 2019-01-30 – 2019-02-03 (×12): 600 mg via INTRAVENOUS
  Filled 2019-01-30 (×13): qty 50

## 2019-01-30 NOTE — Care Management Important Message (Signed)
Important Message  Patient Details  Name: Evan Price MRN: [id number] Date of Birth: 1946/07/21   Medicare Important Message Given:  Marilynne Halsted to nurse to place at bedside)    [name] 01/30/2019, 2:49 PM

## 2019-01-30 NOTE — Progress Notes (Signed)
PROGRESS NOTE  Evan Price LOV:[id number] DOB: 07/31/1946 DOA: 01/28/2019 PCP: Donalynn Furlong, MD   Brief History:  73 year old male with history of asthma, chronic back pain, anemia, and GERD presenting with 2-week history of fatigue and malaise.  On 01/25/2019, the patient developed a cough and some shortness of breath with worsening fatigue.  His cough was mostly nonproductive, but he would occasionally produce some yellow mucus.  He denied hemoptysis.  He denies any recent travels or sick contacts.  He had some nausea without any emesis or diarrhea.  He has had poor oral intake.  The patient has never smoked.  He denied any chest pain, abdominal pain, dysuria, hematuria, sore throat.  He did have an occasional frontal headache.  He denied any rashes or synovitis.  Because of his symptoms, the patient went to the emergency department at [facility].  At that time, the patient was noted to have WBC 18.0 with serum creatinine 1.20.  The patient was told that he had pneumonia.  He was given levofloxacin.  There was an attempt to transfer to [facility] long hospital to rule out novel coronavirus infection., but the patient wanted to come to [facility].  Nevertheless, the patient was discharged from the ED.  His family drove him subsequently to [facility] ED.   In the emergency department, the patient had a low-grade temperature nine 9.2 with blood pressure of 84/65.  The patient was fluid resuscitated.  WBC was 17.0 with lactic acid 2.3.  Chest x-ray showed peribronchial thickening without consolidation.  He was started on levofloxacin and admitted for further evaluation.  Assessment/Plan: Sepsis -due to pneumonia -Lactic acid peaked 2.3 -Follow blood cultures although these are likely going to be negative as the patient received levofloxacin from the ED in [place] prior to coming to [facility] -Urinalysis negative for pyuria -Check procalcitonin 0.25>>>0.15 -Influenza PCR--neg -Viral respiratory  panel--neg -Urine Legionella antigen--pending -Urine Streptococcus pneumoniae antigen--neg -continue IVF -am CBC  Lobar Pneumonia/LUL necrotizing mass -Personally reviewed chest x-ray--peribronchial thickening without consolidation-neg -Influenza PCR--neg -Viral respiratory panel--neg -01/29/19 CTA chest--neg for PE; cavitary lesion apical LUL with adjacent consolidation -favor necrotizing PNA, but cannot r/o malignancy, MTB vs non-tuberculous mycobacteria -appreciate pulmonary consult -Quantiferon -sputum for AFB x 3  Iron deficiency anemia -Holding on repletion of iron until etiology of infectious process clarified  B12 deficiency -Start supplementation  Elevated d-dimer  -CT angiogram chest -personally reviewed EKG--sinus tachycardia, no STT changes  Asthma -Stable without wheezing -Albuterol nebulizer as needed shortness of breath  Chronic back pain -Continue home dose gabapentin and hydrocodone  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 He is feeling stronger.  Objective: Vitals:   01/30/19 1100 01/30/19 1200 01/30/19 1300 01/30/19 1600  BP:      Pulse: 90 (!) 56 84   Resp: 19 15 18    Temp:  97.7 F (36.5 C)  98.3 F (36.8 C)  TempSrc:  Axillary  Oral  SpO2: 99% 92% 98%  Weight:      Height:        Intake/Output Summary (Last 24 hours) at 01/30/2019 1805 Last data filed at 01/30/2019 0300 Gross per 24 hour  Intake 150 ml  Output 350 ml  Net -200 ml    Weight change: 1.921 kg Exam:   General:  Pt is alert, follows commands appropriately, not in acute distress  HEENT: No icterus, No thrush, No neck mass, [place]/AT  Cardiovascular: RRR, S1/S2, no rubs, no gallops  Respiratory: CTA bilaterally, no wheezing, no crackles, no rhonchi  Abdomen: Soft/+BS, non tender, non distended, no guarding  Extremities: No edema, No lymphangitis, No petechiae, No rashes, no synovitis   Data Reviewed: I have personally reviewed following labs and imaging studies Basic Metabolic Panel: Recent Labs  Lab 01/28/19 1949 01/29/19 0426 01/30/19 0555  NA 136 137 138  K 3.9 3.9 3.6  CL 102 107 108  CO2 22 21* 23  GLUCOSE 104* 104* 102*  BUN 19 18 15   CREATININE 1.14 1.13 0.98  CALCIUM 8.6* 8.1* 8.4*   Liver Function Tests: Recent Labs  Lab 01/28/19 1949 01/29/19 0426  AST 17 13*  ALT 14 12  ALKPHOS 104 89  BILITOT 0.9 0.9  PROT 6.9 6.1*  ALBUMIN 2.4* 2.0*   No results for input(s): LIPASE, AMYLASE in the last 168 hours. No results for input(s): AMMONIA in the last 168 hours. Coagulation Profile: Recent Labs  Lab 01/28/19 1949  INR 1.4*   CBC: Recent Labs  Lab 01/28/19 1949 01/29/19 0426 01/30/19 0555  WBC 17.0* 14.7* 10.6*  NEUTROABS 14.6*  --   --   HGB 9.5* 8.5* 8.6*  HCT 30.3* 27.9*   25.6* 28.5*  MCV 87.3 88.0 90.2  PLT 465* 390 371   Cardiac Enzymes: Recent Labs  Lab 01/28/19 1949 01/30/19 0555 01/30/19 1137 01/30/19 1644  TROPONINI <0.03 <0.03 <0.03 <0.03   BNP: Invalid input(s): POCBNP CBG: No results for input(s): GLUCAP in the last 168 hours. HbA1C: No results for input(s): HGBA1C in the last 72 hours. Urine analysis:    Component Value Date/Time   COLORURINE AMBER (A) 01/28/2019 1845   APPEARANCEUR CLEAR 01/28/2019 1845   LABSPEC 1.018 01/28/2019 1845   PHURINE 5.0 01/28/2019 1845   GLUCOSEU NEGATIVE 01/28/2019 1845   HGBUR SMALL (A) 01/28/2019 1845   BILIRUBINUR NEGATIVE 01/28/2019 1845    KETONESUR 20 (A) 01/28/2019 1845   PROTEINUR NEGATIVE 01/28/2019 1845   NITRITE NEGATIVE 01/28/2019 1845   LEUKOCYTESUR NEGATIVE 01/28/2019 1845   Sepsis Labs: @LABRCNTIP (procalcitonin:4,lacticidven:4) ) Recent Results (from the past 240 hour(s))  SARS Coronavirus 2 (CEPHEID- Performed in [place] hospital lab), Hosp Order     Status: None   Collection Time: 01/28/19  7:35 PM  Result Value Ref Range Status   SARS Coronavirus 2 NEGATIVE NEGATIVE Final    Comment: (NOTE) If result is NEGATIVE SARS-CoV-2 target nucleic acids are NOT DETECTED. The SARS-CoV-2 RNA is generally detectable in upper and lower  respiratory specimens during the acute phase of infection. The lowest  concentration of SARS-CoV-2 viral copies this assay can detect is 250  copies / mL. A negative result does not preclude SARS-CoV-2 infection  and should not be used as the sole basis for treatment or other  patient management decisions.  A negative result may occur with  improper specimen collection / handling, submission of specimen other  than nasopharyngeal swab, presence of viral mutation(s) within the  areas targeted by this assay, and inadequate number of  viral copies  (<250 copies / mL). A negative result must be combined with clinical  observations, patient history, and epidemiological information. If result is POSITIVE SARS-CoV-2 target nucleic acids are DETECTED. The SARS-CoV-2 RNA is generally detectable in upper and lower  respiratory specimens dur ing the acute phase of infection.  Positive  results are indicative of active infection with SARS-CoV-2.  Clinical  correlation with patient history and other diagnostic information is  necessary to determine patient infection status.  Positive results do  not rule out bacterial infection or co-infection with other viruses. If result is PRESUMPTIVE POSTIVE SARS-CoV-2 nucleic acids MAY BE PRESENT.   A presumptive positive result was obtained on the  submitted specimen  and confirmed on repeat testing.  While 2019 novel coronavirus  (SARS-CoV-2) nucleic acids may be present in the submitted sample  additional confirmatory testing may be necessary for epidemiological  and / or clinical management purposes  to differentiate between  SARS-CoV-2 and other Sarbecovirus currently known to infect humans.  If clinically indicated additional testing with an alternate test  methodology [phone]) is advised. The SARS-CoV-2 RNA is generally  detectable in upper and lower respiratory sp ecimens during the acute  phase of infection. The expected result is Negative. Fact Sheet for Patients:  StrictlyIdeas.no Fact Sheet for Healthcare Providers: BankingDealers.co.za This test is not yet approved or cleared by the [place] FDA and has been authorized for detection and/or diagnosis of SARS-CoV-2 by FDA under an Emergency Use Authorization (EUA).  This EUA will remain in effect (meaning this test can be used) for the duration of the COVID-19 declaration under Section 564(b)(1) of the Act, 21 U.S.C. section 360bbb-3(b)(1), unless the authorization is terminated or revoked sooner. Performed at [facility], [address], [place], [place] [address]   Culture, blood (Routine x 2)     Status: None (Preliminary result)   Collection Time: 01/28/19  7:50 PM  Result Value Ref Range Status   Specimen Description BLOOD BLOOD RIGHT WRIST  Final   Special Requests   Final    BOTTLES DRAWN AEROBIC AND ANAEROBIC Blood Culture adequate volume   Culture   Final    NO GROWTH 2 DAYS Performed at [facility], [address], [place], [place] [address]    Report Status PENDING  Incomplete  Culture, blood (Routine x 2)     Status: None (Preliminary result)   Collection Time: 01/28/19  8:00 PM  Result Value Ref Range Status   Specimen Description BLOOD SITE NOT SPECIFIED  Final   Special Requests   Final     BOTTLES DRAWN AEROBIC AND ANAEROBIC Blood Culture adequate volume   Culture   Final    NO GROWTH 2 DAYS Performed at [facility], [address] [place], [place] [address]    Report Status PENDING  Incomplete  MRSA PCR Screening     Status: None   Collection Time: 01/29/19 12:01 AM  Result Value Ref Range Status   MRSA by PCR NEGATIVE NEGATIVE Final    Comment:        The GeneXpert MRSA Assay (FDA approved for NASAL specimens only), is one component of a comprehensive MRSA colonization surveillance program. It is not intended to diagnose MRSA infection nor to guide or monitor treatment for MRSA infections. Performed at [facility], [address], [place], [place] [address]   Respiratory Panel by PCR     Status: None   Collection Time: 01/29/19  8:06 AM  Result Value Ref  Range Status   Adenovirus NOT DETECTED NOT DETECTED Final   Coronavirus 229E NOT DETECTED NOT DETECTED Final    Comment: (NOTE) The Coronavirus on the Respiratory Panel, DOES NOT test for the novel  Coronavirus (2019 nCoV)    Coronavirus HKU1 NOT DETECTED NOT DETECTED Final   Coronavirus NL63 NOT DETECTED NOT DETECTED Final   Coronavirus OC43 NOT DETECTED NOT DETECTED Final   Metapneumovirus NOT DETECTED NOT DETECTED Final   Rhinovirus / Enterovirus NOT DETECTED NOT DETECTED Final   Influenza A NOT DETECTED NOT DETECTED Final   Influenza B NOT DETECTED NOT DETECTED Final   Parainfluenza Virus 1 NOT DETECTED NOT DETECTED Final   Parainfluenza Virus 2 NOT DETECTED NOT DETECTED Final   Parainfluenza Virus 3 NOT DETECTED NOT DETECTED Final   Parainfluenza Virus 4 NOT DETECTED NOT DETECTED Final   Respiratory Syncytial Virus NOT DETECTED NOT DETECTED Final   Bordetella pertussis NOT DETECTED NOT DETECTED Final   Chlamydophila pneumoniae NOT DETECTED NOT DETECTED Final   Mycoplasma pneumoniae NOT DETECTED NOT DETECTED Final    Comment: Performed at [facility], [place] [address] [place], [place]  [address]  Culture, respiratory     Status: None (Preliminary result)   Collection Time: 01/30/19  6:32 AM  Result Value Ref Range Status   Specimen Description   Final    TRACHEAL ASPIRATE Performed at [facility], [address] [place], [place] [address]    Special Requests   Final    NONE Performed at [facility], [address], [place], [place] [address]    Gram Stain   Final    NO WBC SEEN RARE GRAM POSITIVE COCCI IN PAIRS RARE GRAM VARIABLE ROD RARE SQUAMOUS EPITHELIAL CELLS PRESENT Performed at [facility], [address], [place],  [address]    Culture PENDING  Incomplete   Report Status PENDING  Incomplete     Scheduled Meds:  Chlorhexidine Gluconate Cloth  6 each Topical Daily   feeding supplement (PRO-STAT SUGAR FREE 64)  30 mL Oral BID   gabapentin  400 mg Oral TID   vitamin B-12  500 mcg Oral Daily   Continuous Infusions:  clindamycin (CLEOCIN) IV 600 mg (01/30/19 1205)   levofloxacin (LEVAQUIN) IV 750 mg (01/29/19 2131)    Procedures/Studies: Ct Angio Chest Pe W Or Wo Contrast  Result Date: 01/29/2019 CLINICAL DATA:  Elevated D-dimer, shortness of breath EXAM: CT ANGIOGRAPHY CHEST WITH CONTRAST TECHNIQUE: Multidetector CT imaging of the chest was performed using the standard protocol during bolus administration of intravenous contrast. Multiplanar CT image reconstructions and MIPs were obtained to evaluate the vascular anatomy. CONTRAST:  143mL OMNIPAQUE IOHEXOL 350 MG/ML SOLN COMPARISON:  Chest radiographs, 01/28/2019 FINDINGS: Cardiovascular: Satisfactory opacification of the pulmonary arteries to the segmental level. No evidence of pulmonary embolism. Normal heart size. No pericardial effusion. Mediastinum/Nodes: There is soft tissue thickening about the left hilum and enlarged mediastinal and AP window lymph nodes measuring up to 1.2 cm in short axis. Thyroid gland, trachea, and esophagus demonstrate no significant findings. Hiatal hernia.  Lungs/Pleura: There is a cavitary lesion of the apical left upper lobe with adjacent consolidation and volume loss measuring approximately 7.8 x 4.7 by 6.0 cm (series 8, image 33, series 9, image 104). Trace left pleural effusion. There is scattered centrilobular nodularity and ground-glass elsewhere bilaterally, for example in the posterior right upper lobe (series 8, image 57). Upper Abdomen: No acute abnormality. Musculoskeletal: No chest wall abnormality. No acute or  significant osseous findings. Review of the MIP images confirms the above findings. IMPRESSION: 1.  Negative examination for pulmonary embolism. 2. There is a cavitary lesion of the apical left upper lobe with adjacent consolidation and volume loss measuring approximately 7.8 x 4.7 by 6.0 cm (series 8, image 33, series 9, image 104). Trace left pleural effusion. There is scattered centrilobular nodularity and ground-glass elsewhere bilaterally, for example in the posterior right upper lobe (series 8, image 57). There is soft tissue thickening about the left hilum and enlarged mediastinal and AP window lymph nodes measuring up to 1.2 cm in short axis. Findings are concerning for malignancy, with differential consideration of cavitary infection, including tuberculosis. Electronically Signed   By: Eddie Candle M.D.   On: 01/29/2019 10:50   Dg Chest Port 1 View  Result Date: 01/28/2019 CLINICAL DATA:  Cough, congestion EXAM: PORTABLE CHEST 1 VIEW COMPARISON:  None. FINDINGS: Heart is borderline in size. Vascular congestion. Peribronchial thickening. No confluent opacities, effusions or edema. No acute bony abnormality. IMPRESSION: Borderline cardiomegaly with vascular congestion. Peribronchial thickening may reflect bronchitis. Electronically Signed   By: Rolm Baptise M.D.   On: 01/28/2019 19:54    Orson Eva, DO  [facility] Pager [phone]  If 7PM-7AM, please contact night-coverage [email or web address] Password [email or web address] 01/30/2019, 6:05  PM   LOS: 2 days

## 2019-01-30 NOTE — Progress Notes (Addendum)
IV access lost x2. Multiple attempts to regain access. Dr. Carles Collet notified. Unable to give IV antibiotics at this time. IV team consult in place for either PICC or Midline placement. Awaiting call from them for ETA.   Vascular Wellness team called and requested a midline. ETA 1845  Canceled call to [facility]ness due to [facility] PICC team arriving at 1844.

## 2019-01-30 NOTE — Consult Note (Addendum)
Consult requested by: [facility] hospitalist, Dr. Carles Collet Consult requested for: Abnormal chest CT  HPI: This is a 73 year old who has a history of asthma, chronic back pain, reflux.  He says he had been in his usual state of pretty good health up until about 2 weeks ago when he developed generalized malaise but without any specific symptoms.  About 4 days prior to admission he developed a cough and some shortness of breath.  He had more fatigue.  His cough was mostly nonproductive but he occasionally produce some yellow mucus.  He did not have any hemoptysis.  He said he has had nausea but he has not actually vomited or had diarrhea.  He is not having any chest pain abdominal pain dysuria hematuria sore throat rashes joint pain muscle pain.  He did have an occasional headache.  Because he was not feeling well he eventually went to the emergency department and at that time he had a white blood cell count of 18,000 and was told that he had pneumonia.  He was started on levofloxacin he was eventually discharged from the emergency department and came to the emergency department here and was subsequently admitted.  He was felt to be septic from pneumonia.  CT shows a cavitary lesion and he is now on respiratory isolation and working on ruling out tuberculosis.  He does not have any exposures that he knows of.  He is COVID negative and quant to [facility] is pending.  He had his first sputum for [employer] this morning.  Past Medical History:  Diagnosis Date  . Asthma   . Chronic back pain 2015  . Dyslipidemia   . GERD (gastroesophageal reflux disease)   . Pneumonia      Family History  Problem Relation Age of Onset  . Diabetes Mother   . Hypertension Father      Social History   Socioeconomic History  . Marital status: Divorced    Spouse name: Not on file  . Number of children: Not on file  . Years of education: Not on file  . Highest education level: Not on file  Occupational History  . Not on file   Social Needs  . Financial resource strain: Not hard at all  . Food insecurity:    Worry: Never true    Inability: Never true  . Transportation needs:    Medical: No    Non-medical: No  Tobacco Use  . Smoking status: Never Smoker  . Smokeless tobacco: Never Used  Substance and Sexual Activity  . Alcohol use: Yes    Alcohol/week: 3.0 standard drinks    Types: 3 Cans of beer per week    Comment: 3 cans beer/ week  . Drug use: Never  . Sexual activity: Not on file  Lifestyle  . Physical activity:    Days per week: 0 days    Minutes per session: 0 min  . Stress: Not at all  Relationships  . Social connections:    Talks on phone: More than three times a week    Gets together: Three times a week    Attends religious service: More than 4 times per year    Active member of club or organization: Yes    Attends meetings of clubs or organizations: Patient refused    Relationship status: Divorced  Other Topics Concern  . Not on file  Social History Narrative  . Not on file     ROS: Except as mentioned 10 point review  of systems is negative    Objective: Vital signs in last 24 hours: Temp:  [97.9 F (36.6 C)-98.1 F (36.7 C)] 97.9 F (36.6 C) (05/08 0400) Pulse Rate:  [78-92] 86 (05/08 0400) Resp:  [15-25] 18 (05/08 0000) BP: (95-108)/(59-68) 103/66 (05/08 0400) SpO2:  [95 %-100 %] 100 % (05/08 0622) Weight:  [68.6 kg] 68.6 kg (05/08 0600) Weight change: 1.921 kg Last BM Date: 01/29/19  Intake/Output from previous day: 05/07 0701 - 05/08 0700 In: 152.9 [I.V.:2.9; IV Piggyback:150] Out: 350 [Urine:350]  PHYSICAL EXAM Constitutional: He is well-developed well-nourished in no acute distress.  Eyes: Pupils react EOMI.  Ears nose mouth and throat: Mucous membranes are moist.  Cardiovascular: His heart is regular with normal heart sounds.  Respiratory: He has some minimal wheezing bilaterally but mostly clear.  Gastrointestinal: His abdomen is soft with no masses.   Musculoskeletal: Grossly normal strength upper and lower extremities bilaterally.  Neurological: No focal abnormalities.  Psychiatric: He is mildly anxious  Lab Results: Basic Metabolic Panel: Recent Labs    01/29/19 0426 01/30/19 0555  NA 137 138  K 3.9 3.6  CL 107 108  CO2 21* 23  GLUCOSE 104* 102*  BUN 18 15  CREATININE 1.13 0.98  CALCIUM 8.1* 8.4*   Liver Function Tests: Recent Labs    01/28/19 1949 01/29/19 0426  AST 17 13*  ALT 14 12  ALKPHOS 104 89  BILITOT 0.9 0.9  PROT 6.9 6.1*  ALBUMIN 2.4* 2.0*   No results for input(s): LIPASE, AMYLASE in the last 72 hours. No results for input(s): AMMONIA in the last 72 hours. CBC: Recent Labs    01/28/19 1949 01/29/19 0426 01/30/19 0555  WBC 17.0* 14.7* 10.6*  NEUTROABS 14.6*  --   --   HGB 9.5* 8.5* 8.6*  HCT 30.3* 27.9* 28.5*  MCV 87.3 88.0 90.2  PLT 465* 390 371   Cardiac Enzymes: Recent Labs    01/28/19 1949 01/30/19 0555  TROPONINI <0.03 <0.03   BNP: No results for input(s): PROBNP in the last 72 hours. D-Dimer: Recent Labs    01/28/19 1950  DDIMER 1.83*   CBG: No results for input(s): GLUCAP in the last 72 hours. Hemoglobin A1C: No results for input(s): HGBA1C in the last 72 hours. Fasting Lipid Panel: No results for input(s): CHOL, HDL, LDLCALC, TRIG, CHOLHDL, LDLDIRECT in the last 72 hours. Thyroid Function Tests: Recent Labs    01/28/19 2140  TSH 0.223*   Anemia Panel: Recent Labs    01/28/19 2140  VITAMINB12 119*  FERRITIN 340*  TIBC 183*  IRON 10*   Coagulation: Recent Labs    01/28/19 1949  LABPROT 17.0*  INR 1.4*   Urine Drug Screen: Drugs of Abuse  No results found for: LABOPIA, COCAINSCRNUR, LABBENZ, AMPHETMU, THCU, LABBARB  Alcohol Level: No results for input(s): ETH in the last 72 hours. Urinalysis: Recent Labs    01/28/19 1845  COLORURINE AMBER*  LABSPEC 1.018  PHURINE 5.0  GLUCOSEU NEGATIVE  HGBUR SMALL*  BILIRUBINUR NEGATIVE  KETONESUR 20*   PROTEINUR NEGATIVE  NITRITE NEGATIVE  LEUKOCYTESUR NEGATIVE   Misc. Labs:   ABGS: No results for input(s): PHART, PO2ART, TCO2, HCO3 in the last 72 hours.  Invalid input(s): PCO2   MICROBIOLOGY: Recent Results (from the past 240 hour(s))  SARS Coronavirus 2 (CEPHEID- Performed in [place] hospital lab), Hosp Order     Status: None   Collection Time: 01/28/19  7:35 PM  Result Value Ref Range Status   SARS  Coronavirus 2 NEGATIVE NEGATIVE Final    Comment: (NOTE) If result is NEGATIVE SARS-CoV-2 target nucleic acids are NOT DETECTED. The SARS-CoV-2 RNA is generally detectable in upper and lower  respiratory specimens during the acute phase of infection. The lowest  concentration of SARS-CoV-2 viral copies this assay can detect is 250  copies / mL. A negative result does not preclude SARS-CoV-2 infection  and should not be used as the sole basis for treatment or other  patient management decisions.  A negative result may occur with  improper specimen collection / handling, submission of specimen other  than nasopharyngeal swab, presence of viral mutation(s) within the  areas targeted by this assay, and inadequate number of viral copies  (<250 copies / mL). A negative result must be combined with clinical  observations, patient history, and epidemiological information. If result is POSITIVE SARS-CoV-2 target nucleic acids are DETECTED. The SARS-CoV-2 RNA is generally detectable in upper and lower  respiratory specimens dur ing the acute phase of infection.  Positive  results are indicative of active infection with SARS-CoV-2.  Clinical  correlation with patient history and other diagnostic information is  necessary to determine patient infection status.  Positive results do  not rule out bacterial infection or co-infection with other viruses. If result is PRESUMPTIVE POSTIVE SARS-CoV-2 nucleic acids MAY BE PRESENT.   A presumptive positive result was obtained on the  submitted specimen  and confirmed on repeat testing.  While 2019 novel coronavirus  (SARS-CoV-2) nucleic acids may be present in the submitted sample  additional confirmatory testing may be necessary for epidemiological  and / or clinical management purposes  to differentiate between  SARS-CoV-2 and other Sarbecovirus currently known to infect humans.  If clinically indicated additional testing with an alternate test  methodology [phone]) is advised. The SARS-CoV-2 RNA is generally  detectable in upper and lower respiratory sp ecimens during the acute  phase of infection. The expected result is Negative. Fact Sheet for Patients:  StrictlyIdeas.no Fact Sheet for Healthcare Providers: BankingDealers.co.za This test is not yet approved or cleared by the [place] FDA and has been authorized for detection and/or diagnosis of SARS-CoV-2 by FDA under an Emergency Use Authorization (EUA).  This EUA will remain in effect (meaning this test can be used) for the duration of the COVID-19 declaration under Section 564(b)(1) of the Act, 21 U.S.C. section 360bbb-3(b)(1), unless the authorization is terminated or revoked sooner. Performed at [facility], [address] [place], [place] [address]   Culture, blood (Routine x 2)     Status: None (Preliminary result)   Collection Time: 01/28/19  7:50 PM  Result Value Ref Range Status   Specimen Description BLOOD BLOOD RIGHT WRIST  Final   Special Requests   Final    BOTTLES DRAWN AEROBIC AND ANAEROBIC Blood Culture adequate volume   Culture   Final    NO GROWTH 2 DAYS Performed at [facility], [address], [place], [place] [address]    Report Status PENDING  Incomplete  Culture, blood (Routine x 2)     Status: None (Preliminary result)   Collection Time: 01/28/19  8:00 PM  Result Value Ref Range Status   Specimen Description BLOOD SITE NOT SPECIFIED  Final   Special Requests   Final     BOTTLES DRAWN AEROBIC AND ANAEROBIC Blood Culture adequate volume   Culture   Final    NO GROWTH 2 DAYS Performed at [facility], [address], [place], [place] [address]  Report Status PENDING  Incomplete  MRSA PCR Screening     Status: None   Collection Time: 01/29/19 12:01 AM  Result Value Ref Range Status   MRSA by PCR NEGATIVE NEGATIVE Final    Comment:        The GeneXpert MRSA Assay (FDA approved for NASAL specimens only), is one component of a comprehensive MRSA colonization surveillance program. It is not intended to diagnose MRSA infection nor to guide or monitor treatment for MRSA infections. Performed at [facility], [address], [place], [place] [address]   Respiratory Panel by PCR     Status: None   Collection Time: 01/29/19  8:06 AM  Result Value Ref Range Status   Adenovirus NOT DETECTED NOT DETECTED Final   Coronavirus 229E NOT DETECTED NOT DETECTED Final    Comment: (NOTE) The Coronavirus on the Respiratory Panel, DOES NOT test for the novel  Coronavirus (2019 nCoV)    Coronavirus HKU1 NOT DETECTED NOT DETECTED Final   Coronavirus NL63 NOT DETECTED NOT DETECTED Final   Coronavirus OC43 NOT DETECTED NOT DETECTED Final   Metapneumovirus NOT DETECTED NOT DETECTED Final   Rhinovirus / Enterovirus NOT DETECTED NOT DETECTED Final   Influenza A NOT DETECTED NOT DETECTED Final   Influenza B NOT DETECTED NOT DETECTED Final   Parainfluenza Virus 1 NOT DETECTED NOT DETECTED Final   Parainfluenza Virus 2 NOT DETECTED NOT DETECTED Final   Parainfluenza Virus 3 NOT DETECTED NOT DETECTED Final   Parainfluenza Virus 4 NOT DETECTED NOT DETECTED Final   Respiratory Syncytial Virus NOT DETECTED NOT DETECTED Final   Bordetella pertussis NOT DETECTED NOT DETECTED Final   Chlamydophila pneumoniae NOT DETECTED NOT DETECTED Final   Mycoplasma pneumoniae NOT DETECTED NOT DETECTED Final    Comment: Performed at [facility], [place] [address], [place], [place]  [address]    Studies/Results: Ct Angio Chest Pe W Or Wo Contrast  Result Date: 01/29/2019 CLINICAL DATA:  Elevated D-dimer, shortness of breath EXAM: CT ANGIOGRAPHY CHEST WITH CONTRAST TECHNIQUE: Multidetector CT imaging of the chest was performed using the standard protocol during bolus administration of intravenous contrast. Multiplanar CT image reconstructions and MIPs were obtained to evaluate the vascular anatomy. CONTRAST:  161mL OMNIPAQUE IOHEXOL 350 MG/ML SOLN COMPARISON:  Chest radiographs, 01/28/2019 FINDINGS: Cardiovascular: Satisfactory opacification of the pulmonary arteries to the segmental level. No evidence of pulmonary embolism. Normal heart size. No pericardial effusion. Mediastinum/Nodes: There is soft tissue thickening about the left hilum and enlarged mediastinal and AP window lymph nodes measuring up to 1.2 cm in short axis. Thyroid gland, trachea, and esophagus demonstrate no significant findings. Hiatal hernia. Lungs/Pleura: There is a cavitary lesion of the apical left upper lobe with adjacent consolidation and volume loss measuring approximately 7.8 x 4.7 by 6.0 cm (series 8, image 33, series 9, image 104). Trace left pleural effusion. There is scattered centrilobular nodularity and ground-glass elsewhere bilaterally, for example in the posterior right upper lobe (series 8, image 57). Upper Abdomen: No acute abnormality. Musculoskeletal: No chest wall abnormality. No acute or significant osseous findings. Review of the MIP images confirms the above findings. IMPRESSION: 1.  Negative examination for pulmonary embolism. 2. There is a cavitary lesion of the apical left upper lobe with adjacent consolidation and volume loss measuring approximately 7.8 x 4.7 by 6.0 cm (series 8, image 33, series 9, image 104). Trace left pleural effusion. There is scattered centrilobular nodularity and ground-glass elsewhere bilaterally, for example in the posterior right upper lobe (  series 8, image 57). There  is soft tissue thickening about the left hilum and enlarged mediastinal and AP window lymph nodes measuring up to 1.2 cm in short axis. Findings are concerning for malignancy, with differential consideration of cavitary infection, including tuberculosis. Electronically Signed   By: Eddie Candle M.D.   On: 01/29/2019 10:50   Dg Chest Port 1 View  Result Date: 01/28/2019 CLINICAL DATA:  Cough, congestion EXAM: PORTABLE CHEST 1 VIEW COMPARISON:  None. FINDINGS: Heart is borderline in size. Vascular congestion. Peribronchial thickening. No confluent opacities, effusions or edema. No acute bony abnormality. IMPRESSION: Borderline cardiomegaly with vascular congestion. Peribronchial thickening may reflect bronchitis. Electronically Signed   By: Rolm Baptise M.D.   On: 01/28/2019 19:54    Medications:  Prior to Admission:  Medications Prior to Admission  Medication Sig Dispense Refill Last Dose  . albuterol (PROVENTIL) (2.5 MG/3ML) 0.083% nebulizer solution Take 2.5 mg by nebulization every 6 (six) hours as needed for wheezing or shortness of breath.   6 01/28/2019 at Unknown time  . gabapentin (NEURONTIN) 400 MG capsule Take 1 capsule by mouth 3 (three) times daily.  1 01/28/2019 at Unknown time  . HYDROcodone-acetaminophen (NORCO/VICODIN) 5-325 MG tablet Take 1 tablet by mouth every 6 (six) hours as needed.  0 01/28/2019 at Unknown time  . VENTOLIN HFA 108 (90 Base) MCG/ACT inhaler   3 01/28/2019 at Unknown time  . chlorproMAZINE (THORAZINE) 25 MG tablet Take 1 tablet (25 mg total) by mouth 3 (three) times daily as needed for up to 3 days for hiccoughs. 9 tablet 0    Scheduled: . Chlorhexidine Gluconate Cloth  6 each Topical Daily  . feeding supplement (PRO-STAT SUGAR FREE 64)  30 mL Oral BID  . gabapentin  400 mg Oral TID  . vitamin B-12  500 mcg Oral Daily   Continuous: . levofloxacin (LEVAQUIN) IV 750 mg (01/29/19 2131)   BEM:[id number], albuterol, guaiFENesin, HYDROcodone-acetaminophen, sodium  chloride HYPERTONIC  Assesment: He was admitted with sepsis and presumed pneumonia.  He has a cavity in the left upper lobe.  He has been coughing up some purulent sputum.  Differential for the cavitary lesion of course is fairly broad and would include a cavitary pneumonia TB or cavitary malignancy.  He has asthma at baseline and he has albuterol ordered.  He has chronic low back pain which apparently is stable  He has protein calorie malnutrition which of course would be consistent with a diagnosis of TB or malignancy  Sepsis pathophysiology has largely resolved.  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  He has iron deficiency anemia on replacement  He is somewhat frustrated with being isolated Principal Problem:   Hypotension Active Problems:   CAP (community acquired pneumonia)   Sepsis ([place])   Chronic back pain   Anemia   Thrombocytosis ([facility])   Severe protein-calorie malnutrition ([place])   Sepsis due to undetermined organism ([place])   Iron deficiency anemia due to chronic blood loss   Iron deficiency anemia   CKD (chronic kidney disease) stage 2, GFR 60-89 ml/min    Plan: Agree with rule out for tuberculosis.  Because of the cavitary nature of his infiltrate I would add coverage for lung abscess.  Continue other treatments.  I do not feel strongly enough that this is tuberculosis that he needs to start TB treatment  Thanks for allowing me to see him with you    LOS: 2 days   Evan Price 01/30/2019, 8:57 AM

## 2019-01-30 NOTE — Progress Notes (Signed)
Sputum sample obtained and sent to lab.  

## 2019-01-30 NOTE — Progress Notes (Signed)
Hypertonic saline treatment and sputum collection vial given to patient for sputum collection/induction.

## 2019-01-31 LAB — BASIC METABOLIC PANEL
Anion gap: 11 (ref 5–15)
BUN: 14 mg/dL (ref 8–23)
CO2: 22 mmol/L (ref 22–32)
Calcium: 8.5 mg/dL — ABNORMAL LOW (ref 8.9–10.3)
Chloride: 105 mmol/L (ref 98–111)
Creatinine, Ser: 1.03 mg/dL (ref 0.61–1.24)
GFR calc Af Amer: >60 mL/min (ref >60)
GFR calc non Af Amer: >60 mL/min (ref >60)
Glucose, Bld: 103 mg/dL — ABNORMAL HIGH (ref 70–99)
Potassium: 3.4 mmol/L — ABNORMAL LOW (ref 3.5–5.1)
Sodium: 138 mmol/L (ref 135–145)

## 2019-01-31 LAB — CBC WITH DIFFERENTIAL/PLATELET
Abs Immature Granulocytes: 0.11 10*3/uL — ABNORMAL HIGH (ref 0.00–0.07)
Basophils Absolute: 0 10*3/uL (ref 0.0–0.1)
Basophils Relative: 0 %
Eosinophils Absolute: 0.3 10*3/uL (ref 0.0–0.5)
Eosinophils Relative: 3 %
HCT: 29.1 % — ABNORMAL LOW (ref 39.0–52.0)
Hemoglobin: 8.8 g/dL — ABNORMAL LOW (ref 13.0–17.0)
Immature Granulocytes: 1 %
Lymphocytes Relative: 10 %
Lymphs Abs: 1.1 10*3/uL (ref 0.7–4.0)
MCH: 26.8 pg (ref 26.0–34.0)
MCHC: 30.2 g/dL (ref 30.0–36.0)
MCV: 88.7 fL (ref 80.0–100.0)
Monocytes Absolute: 0.9 10*3/uL (ref 0.1–1.0)
Monocytes Relative: 9 %
Neutro Abs: 7.9 10*3/uL — ABNORMAL HIGH (ref 1.7–7.7)
Neutrophils Relative %: 77 %
Platelets: 382 10*3/uL (ref 150–400)
RBC: 3.28 MIL/uL — ABNORMAL LOW (ref 4.22–5.81)
RDW: 14.7 % (ref 11.5–15.5)
WBC: 10.3 10*3/uL (ref 4.0–10.5)
nRBC: 0 % (ref 0.0–0.2)

## 2019-01-31 LAB — PROCALCITONIN: Procalcitonin: 0.14 ng/mL

## 2019-01-31 MED ORDER — POTASSIUM CHLORIDE CRYS ER 20 MEQ PO TBCR
20.0000 meq | EXTENDED_RELEASE_TABLET | Freq: Once | ORAL | Status: AC
  Administered 2019-01-31: 20 meq via ORAL
  Filled 2019-01-31: qty 1

## 2019-01-31 NOTE — Progress Notes (Signed)
Subjective: He says he feels a little better.  He is been able to get up and move around some in the room but he is limited by equipment.  He is coughing a little bit.  He feels like he may have a little trouble with his asthma but not much.  He coughs up sputum with hypertonic saline but otherwise he does not.  AFB and QuantiFERON are pending  Objective: Vital signs in last 24 hours: Temp:  [97.7 F (36.5 C)-99.3 F (37.4 C)] 97.9 F (36.6 C) (05/09 0741) Pulse Rate:  [56-105] 90 (05/09 0741) Resp:  [12-31] 13 (05/09 0741) BP: (85-116)/(54-77) 85/54 (05/09 0400) SpO2:  [92 %-100 %] 98 % (05/09 0741) Weight:  [66.5 kg] 66.5 kg (05/09 0500) Weight change: -2.1 kg Last BM Date: 01/29/19  Intake/Output from previous day: 05/08 0701 - 05/09 0700 In: 1060 [P.O.:960; IV Piggyback:100] Out: 500 [Urine:500]  PHYSICAL EXAM General appearance: alert, cooperative and no distress Resp: Some rales on the left Cardio: regular rate and rhythm, S1, S2 normal, no murmur, click, rub or gallop GI: soft, non-tender; bowel sounds normal; no masses,  no organomegaly Extremities: extremities normal, atraumatic, no cyanosis or edema  Lab Results:  Results for orders placed or performed during the hospital encounter of 01/28/19 (from the past 48 hour(s))  Procalcitonin - Baseline     Status: None   Collection Time: 01/29/19  8:40 AM  Result Value Ref Range   Procalcitonin 0.25 ng/mL    Comment:        Interpretation: PCT (Procalcitonin) <= 0.5 ng/mL: Systemic infection (sepsis) is not likely. Local bacterial infection is possible. (NOTE)       Sepsis PCT Algorithm           Lower Respiratory Tract                                      Infection PCT Algorithm    ----------------------------     ----------------------------         PCT < 0.25 ng/mL                PCT < 0.10 ng/mL         Strongly encourage             Strongly discourage   discontinuation of antibiotics    initiation of  antibiotics    ----------------------------     -----------------------------       PCT 0.25 - 0.50 ng/mL            PCT 0.10 - 0.25 ng/mL               OR       >80% decrease in PCT            Discourage initiation of                                            antibiotics      Encourage discontinuation           of antibiotics    ----------------------------     -----------------------------         PCT >= 0.50 ng/mL              PCT 0.26 - 0.50 ng/mL  AND        <80% decrease in PCT             Encourage initiation of                                             antibiotics       Encourage continuation           of antibiotics    ----------------------------     -----------------------------        PCT >= 0.50 ng/mL                  PCT > 0.50 ng/mL               AND         increase in PCT                  Strongly encourage                                      initiation of antibiotics    Strongly encourage escalation           of antibiotics                                     -----------------------------                                           PCT <= 0.25 ng/mL                                                 OR                                        > 80% decrease in PCT                                     Discontinue / Do not initiate                                             antibiotics Performed at [facility], [address] [place], [place] [address]   Troponin I - Now Then Q6H     Status: None   Collection Time: 01/30/19  5:55 AM  Result Value Ref Range   Troponin I <0.03 <0.03 ng/mL    Comment: Performed at [facility], [address] [place], [place] [address]  Procalcitonin     Status: None   Collection Time: 01/30/19  5:55 AM  Result Value Ref Range   Procalcitonin 0.15 ng/mL    Comment:        Interpretation: PCT (Procalcitonin) <= 0.5  ng/mL: Systemic infection (sepsis) is not likely. Local bacterial infection is possible. (NOTE)        Sepsis PCT Algorithm           Lower Respiratory Tract                                      Infection PCT Algorithm    ----------------------------     ----------------------------         PCT < 0.25 ng/mL                PCT < 0.10 ng/mL         Strongly encourage             Strongly discourage   discontinuation of antibiotics    initiation of antibiotics    ----------------------------     -----------------------------       PCT 0.25 - 0.50 ng/mL            PCT 0.10 - 0.25 ng/mL               OR       >80% decrease in PCT            Discourage initiation of                                            antibiotics      Encourage discontinuation           of antibiotics    ----------------------------     -----------------------------         PCT >= 0.50 ng/mL              PCT 0.26 - 0.50 ng/mL               AND        <80% decrease in PCT             Encourage initiation of                                             antibiotics       Encourage continuation           of antibiotics    ----------------------------     -----------------------------        PCT >= 0.50 ng/mL                  PCT > 0.50 ng/mL               AND         increase in PCT                  Strongly encourage                                      initiation of antibiotics    Strongly encourage escalation           of antibiotics                                     -----------------------------  PCT <= 0.25 ng/mL                                                 OR                                        > 80% decrease in PCT                                     Discontinue / Do not initiate                                             antibiotics Performed at [facility], [address] [place], [place] [address]   Basic metabolic panel     Status: Abnormal   Collection Time: 01/30/19  5:55 AM  Result Value Ref Range   Sodium 138 135 - 145 mmol/L   Potassium 3.6 3.5 -  5.1 mmol/L   Chloride 108 98 - 111 mmol/L   CO2 23 22 - 32 mmol/L   Glucose, Bld 102 (H) 70 - 99 mg/dL   BUN 15 8 - 23 mg/dL   Creatinine, Ser 0.98 0.61 - 1.24 mg/dL   Calcium 8.4 (L) 8.9 - 10.3 mg/dL   GFR calc non Af Amer >60 >60 mL/min   GFR calc Af Amer >60 >60 mL/min   Anion gap 7 5 - 15    Comment: Performed at [facility], [address] [place], [place] [address]  CBC     Status: Abnormal   Collection Time: 01/30/19  5:55 AM  Result Value Ref Range   WBC 10.6 (H) 4.0 - 10.5 K/uL   RBC 3.16 (L) 4.22 - 5.81 MIL/uL   Hemoglobin 8.6 (L) 13.0 - 17.0 g/dL   HCT 28.5 (L) 39.0 - 52.0 %   MCV 90.2 80.0 - 100.0 fL   MCH 27.2 26.0 - 34.0 pg   MCHC 30.2 30.0 - 36.0 g/dL   RDW 14.8 11.5 - 15.5 %   Platelets 371 150 - 400 K/uL   nRBC 0.0 0.0 - 0.2 %    Comment: Performed at [facility], [address], [place], [place] [address]  Culture, respiratory     Status: None (Preliminary result)   Collection Time: 01/30/19  6:32 AM  Result Value Ref Range   Specimen Description      TRACHEAL ASPIRATE Performed at [facility], [address] [place], [place] [address]    Special Requests      NONE Performed at [facility], [address], [place], [place] [address]    Gram Stain      NO WBC SEEN RARE GRAM POSITIVE COCCI IN PAIRS RARE GRAM VARIABLE ROD RARE SQUAMOUS EPITHELIAL CELLS PRESENT Performed at [facility], [place] [address], [place], [place] [address]    Culture PENDING    Report Status PENDING   Troponin I - Now Then Q6H     Status: None   Collection Time: 01/30/19 11:37 AM  Result Value Ref Range   Troponin I <0.03 <  0.03 ng/mL    Comment: Performed at [facility], [address], [place], [place] [address]  Troponin I - Now Then Q6H     Status: None   Collection Time: 01/30/19  4:44 PM  Result Value Ref Range   Troponin I <0.03 <0.03 ng/mL    Comment: Performed at [facility], [address], [place], [place] [address]  Procalcitonin     Status: None    Collection Time: 01/31/19  5:01 AM  Result Value Ref Range   Procalcitonin 0.14 ng/mL    Comment:        Interpretation: PCT (Procalcitonin) <= 0.5 ng/mL: Systemic infection (sepsis) is not likely. Local bacterial infection is possible. (NOTE)       Sepsis PCT Algorithm           Lower Respiratory Tract                                      Infection PCT Algorithm    ----------------------------     ----------------------------         PCT < 0.25 ng/mL                PCT < 0.10 ng/mL         Strongly encourage             Strongly discourage   discontinuation of antibiotics    initiation of antibiotics    ----------------------------     -----------------------------       PCT 0.25 - 0.50 ng/mL            PCT 0.10 - 0.25 ng/mL               OR       >80% decrease in PCT            Discourage initiation of                                            antibiotics      Encourage discontinuation           of antibiotics    ----------------------------     -----------------------------         PCT >= 0.50 ng/mL              PCT 0.26 - 0.50 ng/mL               AND        <80% decrease in PCT             Encourage initiation of                                             antibiotics       Encourage continuation           of antibiotics    ----------------------------     -----------------------------        PCT >= 0.50 ng/mL                  PCT > 0.50 ng/mL               AND  increase in PCT                  Strongly encourage                                      initiation of antibiotics    Strongly encourage escalation           of antibiotics                                     -----------------------------                                           PCT <= 0.25 ng/mL                                                 OR                                        > 80% decrease in PCT                                     Discontinue / Do not initiate                                              antibiotics Performed at [facility], [address] [place], [place] [address]   Basic metabolic panel     Status: Abnormal   Collection Time: 01/31/19  5:01 AM  Result Value Ref Range   Sodium 138 135 - 145 mmol/L   Potassium 3.4 (L) 3.5 - 5.1 mmol/L   Chloride 105 98 - 111 mmol/L   CO2 22 22 - 32 mmol/L   Glucose, Bld 103 (H) 70 - 99 mg/dL   BUN 14 8 - 23 mg/dL   Creatinine, Ser 1.03 0.61 - 1.24 mg/dL   Calcium 8.5 (L) 8.9 - 10.3 mg/dL   GFR calc non Af Amer >60 >60 mL/min   GFR calc Af Amer >60 >60 mL/min   Anion gap 11 5 - 15    Comment: Performed at [facility], [address], [place], [place] [address]  CBC with Differential/Platelet     Status: Abnormal   Collection Time: 01/31/19  5:01 AM  Result Value Ref Range   WBC 10.3 4.0 - 10.5 K/uL   RBC 3.28 (L) 4.22 - 5.81 MIL/uL   Hemoglobin 8.8 (L) 13.0 - 17.0 g/dL   HCT 29.1 (L) 39.0 - 52.0 %   MCV 88.7 80.0 - 100.0 fL   MCH 26.8 26.0 - 34.0 pg   MCHC 30.2 30.0 - 36.0 g/dL   RDW 14.7 11.5 - 15.5 %   Platelets 382 150 - 400 K/uL   nRBC 0.0 0.0 - 0.2 %   Neutrophils Relative %  77 %   Neutro Abs 7.9 (H) 1.7 - 7.7 K/uL   Lymphocytes Relative 10 %   Lymphs Abs 1.1 0.7 - 4.0 K/uL   Monocytes Relative 9 %   Monocytes Absolute 0.9 0.1 - 1.0 K/uL   Eosinophils Relative 3 %   Eosinophils Absolute 0.3 0.0 - 0.5 K/uL   Basophils Relative 0 %   Basophils Absolute 0.0 0.0 - 0.1 K/uL   Immature Granulocytes 1 %   Abs Immature Granulocytes 0.11 (H) 0.00 - 0.07 K/uL    Comment: Performed at [facility], [address] [place], [place] [address]    ABGS No results for input(s): PHART, PO2ART, TCO2, HCO3 in the last 72 hours.  Invalid input(s): PCO2 CULTURES Recent Results (from the past 240 hour(s))  SARS Coronavirus 2 (CEPHEID- Performed in [place] hospital lab), Hosp Order     Status: None   Collection Time: 01/28/19  7:35 PM  Result Value Ref Range Status   SARS Coronavirus 2 NEGATIVE NEGATIVE Final     Comment: (NOTE) If result is NEGATIVE SARS-CoV-2 target nucleic acids are NOT DETECTED. The SARS-CoV-2 RNA is generally detectable in upper and lower  respiratory specimens during the acute phase of infection. The lowest  concentration of SARS-CoV-2 viral copies this assay can detect is 250  copies / mL. A negative result does not preclude SARS-CoV-2 infection  and should not be used as the sole basis for treatment or other  patient management decisions.  A negative result may occur with  improper specimen collection / handling, submission of specimen other  than nasopharyngeal swab, presence of viral mutation(s) within the  areas targeted by this assay, and inadequate number of viral copies  (<250 copies / mL). A negative result must be combined with clinical  observations, patient history, and epidemiological information. If result is POSITIVE SARS-CoV-2 target nucleic acids are DETECTED. The SARS-CoV-2 RNA is generally detectable in upper and lower  respiratory specimens dur ing the acute phase of infection.  Positive  results are indicative of active infection with SARS-CoV-2.  Clinical  correlation with patient history and other diagnostic information is  necessary to determine patient infection status.  Positive results do  not rule out bacterial infection or co-infection with other viruses. If result is PRESUMPTIVE POSTIVE SARS-CoV-2 nucleic acids MAY BE PRESENT.   A presumptive positive result was obtained on the submitted specimen  and confirmed on repeat testing.  While 2019 novel coronavirus  (SARS-CoV-2) nucleic acids may be present in the submitted sample  additional confirmatory testing may be necessary for epidemiological  and / or clinical management purposes  to differentiate between  SARS-CoV-2 and other Sarbecovirus currently known to infect humans.  If clinically indicated additional testing with an alternate test  methodology [phone]) is advised. The SARS-CoV-2  RNA is generally  detectable in upper and lower respiratory sp ecimens during the acute  phase of infection. The expected result is Negative. Fact Sheet for Patients:  StrictlyIdeas.no Fact Sheet for Healthcare Providers: BankingDealers.co.za This test is not yet approved or cleared by the [place] FDA and has been authorized for detection and/or diagnosis of SARS-CoV-2 by FDA under an Emergency Use Authorization (EUA).  This EUA will remain in effect (meaning this test can be used) for the duration of the COVID-19 declaration under Section 564(b)(1) of the Act, 21 U.S.C. section 360bbb-3(b)(1), unless the authorization is terminated or revoked sooner. Performed at [facility], [address], [place], [place] [address]   Culture,  blood (Routine x 2)     Status: None (Preliminary result)   Collection Time: 01/28/19  7:50 PM  Result Value Ref Range Status   Specimen Description BLOOD BLOOD RIGHT WRIST  Final   Special Requests   Final    BOTTLES DRAWN AEROBIC AND ANAEROBIC Blood Culture adequate volume   Culture   Final    NO GROWTH 3 DAYS Performed at [facility], [address], [place],  [address]    Report Status PENDING  Incomplete  Culture, blood (Routine x 2)     Status: None (Preliminary result)   Collection Time: 01/28/19  8:00 PM  Result Value Ref Range Status   Specimen Description BLOOD SITE NOT SPECIFIED  Final   Special Requests   Final    BOTTLES DRAWN AEROBIC AND ANAEROBIC Blood Culture adequate volume   Culture   Final    NO GROWTH 3 DAYS Performed at [facility], [address] [place], [place] [address]    Report Status PENDING  Incomplete  MRSA PCR Screening     Status: None   Collection Time: 01/29/19 12:01 AM  Result Value Ref Range Status   MRSA by PCR NEGATIVE NEGATIVE Final    Comment:        The GeneXpert MRSA Assay (FDA approved for NASAL specimens only), is one component of  a comprehensive MRSA colonization surveillance program. It is not intended to diagnose MRSA infection nor to guide or monitor treatment for MRSA infections. Performed at [facility], [address], [place], [place] [address]   Respiratory Panel by PCR     Status: None   Collection Time: 01/29/19  8:06 AM  Result Value Ref Range Status   Adenovirus NOT DETECTED NOT DETECTED Final   Coronavirus 229E NOT DETECTED NOT DETECTED Final    Comment: (NOTE) The Coronavirus on the Respiratory Panel, DOES NOT test for the novel  Coronavirus (2019 nCoV)    Coronavirus HKU1 NOT DETECTED NOT DETECTED Final   Coronavirus NL63 NOT DETECTED NOT DETECTED Final   Coronavirus OC43 NOT DETECTED NOT DETECTED Final   Metapneumovirus NOT DETECTED NOT DETECTED Final   Rhinovirus / Enterovirus NOT DETECTED NOT DETECTED Final   Influenza A NOT DETECTED NOT DETECTED Final   Influenza B NOT DETECTED NOT DETECTED Final   Parainfluenza Virus 1 NOT DETECTED NOT DETECTED Final   Parainfluenza Virus 2 NOT DETECTED NOT DETECTED Final   Parainfluenza Virus 3 NOT DETECTED NOT DETECTED Final   Parainfluenza Virus 4 NOT DETECTED NOT DETECTED Final   Respiratory Syncytial Virus NOT DETECTED NOT DETECTED Final   Bordetella pertussis NOT DETECTED NOT DETECTED Final   Chlamydophila pneumoniae NOT DETECTED NOT DETECTED Final   Mycoplasma pneumoniae NOT DETECTED NOT DETECTED Final    Comment: Performed at [facility], [place] [address], [place], [place] [address]  Culture, respiratory     Status: None (Preliminary result)   Collection Time: 01/30/19  6:32 AM  Result Value Ref Range Status   Specimen Description   Final    TRACHEAL ASPIRATE Performed at [facility], [address] [place], [place] [address]    Special Requests   Final    NONE Performed at [facility], [address], [place], [place] [address]    Gram Stain   Final    NO WBC SEEN RARE GRAM POSITIVE COCCI IN PAIRS RARE GRAM VARIABLE ROD RARE  SQUAMOUS EPITHELIAL CELLS PRESENT Performed at [facility], [address]  [place], [place] [address]    Culture PENDING  Incomplete   Report Status PENDING  Incomplete   Studies/Results: Ct Angio Chest Pe W Or Wo Contrast  Result Date: 01/29/2019 CLINICAL DATA:  Elevated D-dimer, shortness of breath EXAM: CT ANGIOGRAPHY CHEST WITH CONTRAST TECHNIQUE: Multidetector CT imaging of the chest was performed using the standard protocol during bolus administration of intravenous contrast. Multiplanar CT image reconstructions and MIPs were obtained to evaluate the vascular anatomy. CONTRAST:  121mL OMNIPAQUE IOHEXOL 350 MG/ML SOLN COMPARISON:  Chest radiographs, 01/28/2019 FINDINGS: Cardiovascular: Satisfactory opacification of the pulmonary arteries to the segmental level. No evidence of pulmonary embolism. Normal heart size. No pericardial effusion. Mediastinum/Nodes: There is soft tissue thickening about the left hilum and enlarged mediastinal and AP window lymph nodes measuring up to 1.2 cm in short axis. Thyroid gland, trachea, and esophagus demonstrate no significant findings. Hiatal hernia. Lungs/Pleura: There is a cavitary lesion of the apical left upper lobe with adjacent consolidation and volume loss measuring approximately 7.8 x 4.7 by 6.0 cm (series 8, image 33, series 9, image 104). Trace left pleural effusion. There is scattered centrilobular nodularity and ground-glass elsewhere bilaterally, for example in the posterior right upper lobe (series 8, image 57). Upper Abdomen: No acute abnormality. Musculoskeletal: No chest wall abnormality. No acute or significant osseous findings. Review of the MIP images confirms the above findings. IMPRESSION: 1.  Negative examination for pulmonary embolism. 2. There is a cavitary lesion of the apical left upper lobe with adjacent consolidation and volume loss measuring approximately 7.8 x 4.7 by 6.0 cm (series 8, image 33, series 9, image 104). Trace left  pleural effusion. There is scattered centrilobular nodularity and ground-glass elsewhere bilaterally, for example in the posterior right upper lobe (series 8, image 57). There is soft tissue thickening about the left hilum and enlarged mediastinal and AP window lymph nodes measuring up to 1.2 cm in short axis. Findings are concerning for malignancy, with differential consideration of cavitary infection, including tuberculosis. Electronically Signed   By: Eddie Candle M.D.   On: 01/29/2019 10:50    Medications:  Prior to Admission:  Medications Prior to Admission  Medication Sig Dispense Refill Last Dose  . albuterol (PROVENTIL) (2.5 MG/3ML) 0.083% nebulizer solution Take 2.5 mg by nebulization every 6 (six) hours as needed for wheezing or shortness of breath.   6 01/28/2019 at Unknown time  . gabapentin (NEURONTIN) 400 MG capsule Take 1 capsule by mouth 3 (three) times daily.  1 01/28/2019 at Unknown time  . HYDROcodone-acetaminophen (NORCO/VICODIN) 5-325 MG tablet Take 1 tablet by mouth every 6 (six) hours as needed.  0 01/28/2019 at Unknown time  . VENTOLIN HFA 108 (90 Base) MCG/ACT inhaler   3 01/28/2019 at Unknown time  . chlorproMAZINE (THORAZINE) 25 MG tablet Take 1 tablet (25 mg total) by mouth 3 (three) times daily as needed for up to 3 days for hiccoughs. 9 tablet 0    Scheduled: . Chlorhexidine Gluconate Cloth  6 each Topical Daily  . feeding supplement (PRO-STAT SUGAR FREE 64)  30 mL Oral BID  . gabapentin  400 mg Oral TID  . sodium chloride flush  10-40 mL Intracatheter Q12H  . vitamin B-12  500 mcg Oral Daily   Continuous: . clindamycin (CLEOCIN) IV Stopped (01/31/19 0250)  . levofloxacin (LEVAQUIN) IV Stopped (01/30/19 2317)   XBJ:[id number], albuterol, guaiFENesin, HYDROcodone-acetaminophen, sodium chloride flush, sodium chloride HYPERTONIC  Assesment: He is admitted with community-acquired pneumonia that is cavitary.  He was septic initially  but sepsis pathophysiology seems to  have resolved.  He has protein calorie malnutrition and is on supplements  He is anemic but his hemoglobin level is stable  Echocardiogram shows some diastolic dysfunction but systolic function is good   Principal Problem:   Hypotension Active Problems:   CAP (community acquired pneumonia)   Sepsis ([place])   Chronic back pain   Anemia   Thrombocytosis (H[facility])   Severe protein-calorie malnutrition (HCC)   Sepsis due to undetermined organism ([place])   Iron deficiency anemia due to chronic blood loss   Iron deficiency anemia   CKD (chronic kidney disease) stage 2, GFR 60-89 ml/min   Lobar pneumonia ([place])    Plan: Continue antibiotics.  Continue checking to try to rule out TB.  No other changes now.    LOS: 3 days   Alonza Bogus 01/31/2019, 8:39 AM

## 2019-01-31 NOTE — Progress Notes (Signed)
Hypertonic saline for sputum induction performed.

## 2019-01-31 NOTE — Progress Notes (Signed)
PROGRESS NOTE  Evan Price ZRA:[id number] DOB: 31-May-1946 DOA: 01/28/2019 PCP: Donalynn Furlong, MD  Brief History: 73 year old male with history of asthma, chronic back pain, anemia, and GERD presenting with 2-week history of fatigue and malaise. On 01/25/2019, the patient developed a cough and some shortness of breath with worsening fatigue. His cough was mostly nonproductive, but he would occasionally produce some yellow mucus. He denied hemoptysis. He denies any recent travels or sick contacts. He had some nausea without any emesis or diarrhea. He has had poor oral intake. The patient has never smoked. He denied any chest pain, abdominal pain, dysuria, hematuria, sore throat. He did have an occasional frontal headache. He denied any rashes or synovitis. Because of his symptoms, the patient went to the emergency department at [facility].At that time, the patient was noted to have WBC 18.0 with serum creatinine 1.20. The patient was told that he had pneumonia. He was given levofloxacin. There was an attempt to transfer to [facility] long hospital to rule out novel coronavirus infection., but the patient wanted to come to [facility].Nevertheless, the patient was discharged from the ED. His family drove him subsequently to [place].  In the emergency department, the patient had a low-grade temperature nine 9.2 with blood pressure of 84/65. The patient was fluid resuscitated. WBC was 17.0 with lactic acid 2.3. Chest x-ray showed peribronchial thickening without consolidation. He was started on levofloxacin and admitted for further evaluation.  CTA chest showed LUL cavitary consolidation.  Pulmonary medicine was consulted to assist with management.  Assessment/Plan: Sepsis -due to pneumonia -Lactic acid peaked 2.3 -Follow blood cultures although these are likely going to be negative as the patient received levofloxacin from the EDin [place] prior to coming to [facility] -Urinalysis  negative for pyuria -Check procalcitonin 0.25>>>0.15 -Influenza PCR--neg -Viral respiratory panel--neg -Urine Legionella antigen--pending -Urine Streptococcus pneumoniae antigen--neg -continue IVF -sepsis physiology resolved -am CBC  Lobar Pneumonia/LUL necrotizing mass -Personally reviewed chest x-ray--peribronchial thickening without consolidation-neg -Influenza PCR--neg -Viral respiratory panel--neg -01/29/19 CTA chest--neg for PE; cavitary lesion apical LUL with adjacent consolidation -favor necrotizing PNA, but cannot r/o malignancy, MTB vs non-tuberculous mycobacteria -appreciate pulmonary consult -Quantiferon--pending -sputum for AFB x 3--pending  Iron deficiency anemia -Holding on repletion of iron until etiology of infectious process clarified  B12 deficiency -Started supplementation  Elevated d-dimer -CT angiogram chest as above -personally reviewed EKG--sinus tachycardia, no STT changes  Asthma -Stable without wheezing -Albuterol nebulizer as needed shortness of breath  Chronic back pain -Continue home dose gabapentin and hydrocodone  CKD stage II -Baseline creatinine 1.0-1.3  Poor IV access -PICC placed     Disposition Plan: Home in 2-3days  Family Communication:NoFamily at bedside  Consultants:none  Code Status: FULL   DVT Prophylaxis: [place] Lovenox   Procedures: As Listed in Progress Note Above  Antibiotics: levoflox 5/6>>> vanco 5/7 x 1 clinda 5/8>>>     Subjective: Patient denies fevers, chills, headache, chest pain, dyspnea, nausea, vomiting, diarrhea, abdominal pain, dysuria, hematuria, hematochezia, and melena. He is feeling stronger.  Still has essentially nonproductive cough.  No hemoptysis  Objective: Vitals:   01/31/19 0900 01/31/19 1000 01/31/19 1107 01/31/19 1620  BP:      Pulse: 98 86 81 85  Resp: 15 (!) 22 13 16   Temp:   98.2 F (36.8 C) (!) 97.4 F (36.3 C)  TempSrc:   Oral Oral   SpO2: 100% 96% 99% 100%  Weight:      Height:  Intake/Output Summary (Last 24 hours) at 01/31/2019 1706 Last data filed at 01/31/2019 1000 Gross per 24 hour  Intake 1300 ml  Output 500 ml  Net 800 ml   Weight change: -2.1 kg Exam:   General:  Pt is alert, follows commands appropriately, not in acute distress  HEENT: No icterus, No thrush, No neck mass, [place]/AT  Cardiovascular: RRR, S1/S2, no rubs, no gallops  Respiratory: L-rales. R-CTA. No wheeze  Abdomen: Soft/+BS, non tender, non distended, no guarding  Extremities: No edema, No lymphangitis, No petechiae, No rashes, no synovitis   Data Reviewed: I have personally reviewed following labs and imaging studies Basic Metabolic Panel: Recent Labs  Lab 01/28/19 1949 01/29/19 0426 01/30/19 0555 01/31/19 0501  NA 136 137 138 138  K 3.9 3.9 3.6 3.4*  CL 102 107 108 105  CO2 22 21* 23 22  GLUCOSE 104* 104* 102* 103*  BUN 19 18 15 14   CREATININE 1.14 1.13 0.98 1.03  CALCIUM 8.6* 8.1* 8.4* 8.5*   Liver Function Tests: Recent Labs  Lab 01/28/19 1949 01/29/19 0426  AST 17 13*  ALT 14 12  ALKPHOS 104 89  BILITOT 0.9 0.9  PROT 6.9 6.1*  ALBUMIN 2.4* 2.0*   No results for input(s): LIPASE, AMYLASE in the last 168 hours. No results for input(s): AMMONIA in the last 168 hours. Coagulation Profile: Recent Labs  Lab 01/28/19 1949  INR 1.4*   CBC: Recent Labs  Lab 01/28/19 1949 01/29/19 0426 01/30/19 0555 01/31/19 0501  WBC 17.0* 14.7* 10.6* 10.3  NEUTROABS 14.6*  --   --  7.9*  HGB 9.5* 8.5* 8.6* 8.8*  HCT 30.3* 27.9*  25.6* 28.5* 29.1*  MCV 87.3 88.0 90.2 88.7  PLT 465* 390 371 382   Cardiac Enzymes: Recent Labs  Lab 01/28/19 1949 01/30/19 0555 01/30/19 1137 01/30/19 1644  TROPONINI <0.03 <0.03 <0.03 <0.03   BNP: Invalid input(s): POCBNP CBG: No results for input(s): GLUCAP in the last 168 hours. HbA1C: No results for input(s): HGBA1C in the last 72 hours. Urine analysis:    Component  Value Date/Time   COLORURINE AMBER (A) 01/28/2019 1845   APPEARANCEUR CLEAR 01/28/2019 1845   LABSPEC 1.018 01/28/2019 1845   PHURINE 5.0 01/28/2019 1845   GLUCOSEU NEGATIVE 01/28/2019 1845   HGBUR SMALL (A) 01/28/2019 1845   BILIRUBINUR NEGATIVE 01/28/2019 1845   KETONESUR 20 (A) 01/28/2019 1845   PROTEINUR NEGATIVE 01/28/2019 1845   NITRITE NEGATIVE 01/28/2019 1845   LEUKOCYTESUR NEGATIVE 01/28/2019 1845   Sepsis Labs: @LABRCNTIP (procalcitonin:4,lacticidven:4) ) Recent Results (from the past 240 hour(s))  SARS Coronavirus 2 (CEPHEID- Performed in [facility] hospital lab), Hosp Order     Status: None   Collection Time: 01/28/19  7:35 PM  Result Value Ref Range Status   SARS Coronavirus 2 NEGATIVE NEGATIVE Final    Comment: (NOTE) If result is NEGATIVE SARS-CoV-2 target nucleic acids are NOT DETECTED. The SARS-CoV-2 RNA is generally detectable in upper and lower  respiratory specimens during the acute phase of infection. The lowest  concentration of SARS-CoV-2 viral copies this assay can detect is 250  copies / mL. A negative result does not preclude SARS-CoV-2 infection  and should not be used as the sole basis for treatment or other  patient management decisions.  A negative result may occur with  improper specimen collection / handling, submission of specimen other  than nasopharyngeal swab, presence of viral mutation(s) within the  areas targeted by this assay, and inadequate number of viral copies  (<  250 copies / mL). A negative result must be combined with clinical  observations, patient history, and epidemiological information. If result is POSITIVE SARS-CoV-2 target nucleic acids are DETECTED. The SARS-CoV-2 RNA is generally detectable in upper and lower  respiratory specimens dur ing the acute phase of infection.  Positive  results are indicative of active infection with SARS-CoV-2.  Clinical  correlation with patient history and other diagnostic information is   necessary to determine patient infection status.  Positive results do  not rule out bacterial infection or co-infection with other viruses. If result is PRESUMPTIVE POSTIVE SARS-CoV-2 nucleic acids MAY BE PRESENT.   A presumptive positive result was obtained on the submitted specimen  and confirmed on repeat testing.  While 2019 novel coronavirus  (SARS-CoV-2) nucleic acids may be present in the submitted sample  additional confirmatory testing may be necessary for epidemiological  and / or clinical management purposes  to differentiate between  SARS-CoV-2 and other Sarbecovirus currently known to infect humans.  If clinically indicated additional testing with an alternate test  methodology [phone]) is advised. The SARS-CoV-2 RNA is generally  detectable in upper and lower respiratory sp ecimens during the acute  phase of infection. The expected result is Negative. Fact Sheet for Patients:  StrictlyIdeas.no Fact Sheet for Healthcare Providers: BankingDealers.co.za This test is not yet approved or cleared by the [place] FDA and has been authorized for detection and/or diagnosis of SARS-CoV-2 by FDA under an Emergency Use Authorization (EUA).  This EUA will remain in effect (meaning this test can be used) for the duration of the COVID-19 declaration under Section 564(b)(1) of the Act, 21 U.S.C. section 360bbb-3(b)(1), unless the authorization is terminated or revoked sooner. Performed at [facility], [address], [place], [place] [address]   Culture, blood (Routine x 2)     Status: None (Preliminary result)   Collection Time: 01/28/19  7:50 PM  Result Value Ref Range Status   Specimen Description BLOOD BLOOD RIGHT WRIST  Final   Special Requests   Final    BOTTLES DRAWN AEROBIC AND ANAEROBIC Blood Culture adequate volume   Culture   Final    NO GROWTH 3 DAYS Performed at [facility], [address] [place], [place] [address]     Report Status PENDING  Incomplete  Culture, blood (Routine x 2)     Status: None (Preliminary result)   Collection Time: 01/28/19  8:00 PM  Result Value Ref Range Status   Specimen Description BLOOD SITE NOT SPECIFIED  Final   Special Requests   Final    BOTTLES DRAWN AEROBIC AND ANAEROBIC Blood Culture adequate volume   Culture   Final    NO GROWTH 3 DAYS Performed at [facility], [address] [place], [place] [address]    Report Status PENDING  Incomplete  MRSA PCR Screening     Status: None   Collection Time: 01/29/19 12:01 AM  Result Value Ref Range Status   MRSA by PCR NEGATIVE NEGATIVE Final    Comment:        The GeneXpert MRSA Assay (FDA approved for NASAL specimens only), is one component of a comprehensive MRSA colonization surveillance program. It is not intended to diagnose MRSA infection nor to guide or monitor treatment for MRSA infections. Performed at [facility], [address] [place], [place] [address]   Respiratory Panel by PCR     Status: None   Collection Time: 01/29/19  8:06 AM  Result Value Ref Range Status  Adenovirus NOT DETECTED NOT DETECTED Final   Coronavirus 229E NOT DETECTED NOT DETECTED Final    Comment: (NOTE) The Coronavirus on the Respiratory Panel, DOES NOT test for the novel  Coronavirus (2019 nCoV)    Coronavirus HKU1 NOT DETECTED NOT DETECTED Final   Coronavirus NL63 NOT DETECTED NOT DETECTED Final   Coronavirus OC43 NOT DETECTED NOT DETECTED Final   Metapneumovirus NOT DETECTED NOT DETECTED Final   Rhinovirus / Enterovirus NOT DETECTED NOT DETECTED Final   Influenza A NOT DETECTED NOT DETECTED Final   Influenza B NOT DETECTED NOT DETECTED Final   Parainfluenza Virus 1 NOT DETECTED NOT DETECTED Final   Parainfluenza Virus 2 NOT DETECTED NOT DETECTED Final   Parainfluenza Virus 3 NOT DETECTED NOT DETECTED Final   Parainfluenza Virus 4 NOT DETECTED NOT DETECTED Final   Respiratory Syncytial Virus NOT DETECTED NOT DETECTED  Final   Bordetella pertussis NOT DETECTED NOT DETECTED Final   Chlamydophila pneumoniae NOT DETECTED NOT DETECTED Final   Mycoplasma pneumoniae NOT DETECTED NOT DETECTED Final    Comment: Performed at [facility], [place] [address] [place], [place] [address]  Culture, respiratory     Status: None (Preliminary result)   Collection Time: 01/30/19  6:32 AM  Result Value Ref Range Status   Specimen Description   Final    TRACHEAL ASPIRATE Performed at [facility], [address], [place], [place] [address]    Special Requests   Final    NONE Performed at [facility], [address] [place], [place] [address]    Gram Stain   Final    NO WBC SEEN RARE GRAM POSITIVE COCCI IN PAIRS RARE GRAM VARIABLE ROD RARE SQUAMOUS EPITHELIAL CELLS PRESENT    Culture   Final    CULTURE REINCUBATED FOR BETTER GROWTH Performed at [facility], [place] [address] [place], [place] [address]    Report Status PENDING  Incomplete     Scheduled Meds: . Chlorhexidine Gluconate Cloth  6 each Topical Daily  . feeding supplement (PRO-STAT SUGAR FREE 64)  30 mL Oral BID  . gabapentin  400 mg Oral TID  . sodium chloride flush  10-40 mL Intracatheter Q12H  . vitamin B-12  500 mcg Oral Daily   Continuous Infusions: . clindamycin (CLEOCIN) IV 600 mg (01/31/19 1042)  . levofloxacin (LEVAQUIN) IV Stopped (01/30/19 2317)    Procedures/Studies: Ct Angio Chest Pe W Or Wo Contrast  Result Date: 01/29/2019 CLINICAL DATA:  Elevated D-dimer, shortness of breath EXAM: CT ANGIOGRAPHY CHEST WITH CONTRAST TECHNIQUE: Multidetector CT imaging of the chest was performed using the standard protocol during bolus administration of intravenous contrast. Multiplanar CT image reconstructions and MIPs were obtained to evaluate the vascular anatomy. CONTRAST:  1106mL OMNIPAQUE IOHEXOL 350 MG/ML SOLN COMPARISON:  Chest radiographs, 01/28/2019 FINDINGS: Cardiovascular: Satisfactory opacification of the pulmonary arteries to the  segmental level. No evidence of pulmonary embolism. Normal heart size. No pericardial effusion. Mediastinum/Nodes: There is soft tissue thickening about the left hilum and enlarged mediastinal and AP window lymph nodes measuring up to 1.2 cm in short axis. Thyroid gland, trachea, and esophagus demonstrate no significant findings. Hiatal hernia. Lungs/Pleura: There is a cavitary lesion of the apical left upper lobe with adjacent consolidation and volume loss measuring approximately 7.8 x 4.7 by 6.0 cm (series 8, image 33, series 9, image 104). Trace left pleural effusion. There is scattered centrilobular nodularity and ground-glass elsewhere bilaterally, for example in the posterior right upper lobe (series 8, image  57). Upper Abdomen: No acute abnormality. Musculoskeletal: No chest wall abnormality. No acute or significant osseous findings. Review of the MIP images confirms the above findings. IMPRESSION: 1.  Negative examination for pulmonary embolism. 2. There is a cavitary lesion of the apical left upper lobe with adjacent consolidation and volume loss measuring approximately 7.8 x 4.7 by 6.0 cm (series 8, image 33, series 9, image 104). Trace left pleural effusion. There is scattered centrilobular nodularity and ground-glass elsewhere bilaterally, for example in the posterior right upper lobe (series 8, image 57). There is soft tissue thickening about the left hilum and enlarged mediastinal and AP window lymph nodes measuring up to 1.2 cm in short axis. Findings are concerning for malignancy, with differential consideration of cavitary infection, including tuberculosis. Electronically Signed   By: Eddie Candle M.D.   On: 01/29/2019 10:50   Dg Chest Port 1 View  Result Date: 01/28/2019 CLINICAL DATA:  Cough, congestion EXAM: PORTABLE CHEST 1 VIEW COMPARISON:  None. FINDINGS: Heart is borderline in size. Vascular congestion. Peribronchial thickening. No confluent opacities, effusions or edema. No acute bony  abnormality. IMPRESSION: Borderline cardiomegaly with vascular congestion. Peribronchial thickening may reflect bronchitis. Electronically Signed   By: Rolm Baptise M.D.   On: 01/28/2019 19:54    Orson Eva, DO  [facility] Pager [phone]  If 7PM-7AM, please contact night-coverage [email or web address] Password [email or web address] 01/31/2019, 5:06 PM   LOS: 3 days

## 2019-02-01 LAB — CULTURE, RESPIRATORY W GRAM STAIN: Culture: NORMAL

## 2019-02-01 LAB — BASIC METABOLIC PANEL
Anion gap: 10 (ref 5–15)
BUN: 13 mg/dL (ref 8–23)
CO2: 24 mmol/L (ref 22–32)
Calcium: 8.5 mg/dL — ABNORMAL LOW (ref 8.9–10.3)
Chloride: 104 mmol/L (ref 98–111)
Creatinine, Ser: 1.04 mg/dL (ref 0.61–1.24)
GFR calc Af Amer: >60 mL/min (ref >60)
GFR calc non Af Amer: >60 mL/min (ref >60)
Glucose, Bld: 139 mg/dL — ABNORMAL HIGH (ref 70–99)
Potassium: 3.3 mmol/L — ABNORMAL LOW (ref 3.5–5.1)
Sodium: 138 mmol/L (ref 135–145)

## 2019-02-01 LAB — CBC
HCT: 29.5 % — ABNORMAL LOW (ref 39.0–52.0)
Hemoglobin: 8.9 g/dL — ABNORMAL LOW (ref 13.0–17.0)
MCH: 26.6 pg (ref 26.0–34.0)
MCHC: 30.2 g/dL (ref 30.0–36.0)
MCV: 88.1 fL (ref 80.0–100.0)
Platelets: 392 10*3/uL (ref 150–400)
RBC: 3.35 MIL/uL — ABNORMAL LOW (ref 4.22–5.81)
RDW: 14.7 % (ref 11.5–15.5)
WBC: 9.7 10*3/uL (ref 4.0–10.5)
nRBC: 0 % (ref 0.0–0.2)

## 2019-02-01 LAB — ACID FAST SMEAR (AFB, MYCOBACTERIA)
Acid Fast Smear: NEGATIVE
Acid Fast Smear: NEGATIVE

## 2019-02-01 LAB — QUANTIFERON-TB GOLD PLUS (RQFGPL)
QuantiFERON Mitogen Value: 0.22 IU/mL
QuantiFERON Nil Value: 0.01 IU/mL
QuantiFERON TB1 Ag Value: 0.02 IU/mL
QuantiFERON TB2 Ag Value: 0.01 IU/mL

## 2019-02-01 LAB — QUANTIFERON-TB GOLD PLUS: QuantiFERON-TB Gold Plus: UNDETERMINED — AB

## 2019-02-01 LAB — MAGNESIUM: Magnesium: 1.9 mg/dL (ref 1.7–2.4)

## 2019-02-01 LAB — CULTURE, RESPIRATORY: Gram Stain: NONE SEEN

## 2019-02-01 LAB — PHOSPHORUS: Phosphorus: 3.5 mg/dL (ref 2.5–4.6)

## 2019-02-01 MED ORDER — POTASSIUM CHLORIDE CRYS ER 20 MEQ PO TBCR
40.0000 meq | EXTENDED_RELEASE_TABLET | Freq: Once | ORAL | Status: AC
  Administered 2019-02-01: 40 meq via ORAL
  Filled 2019-02-01: qty 2

## 2019-02-01 MED ORDER — CYANOCOBALAMIN 500 MCG PO TABS: 500.0000 ug | ORAL_TABLET | Freq: Every day | ORAL | Status: DC

## 2019-02-01 NOTE — Discharge Summary (Signed)
Physician Discharge Summary  Evan Price NID:[id number] DOB: September 09, 1946 DOA: 01/28/2019  PCP: Donalynn Furlong, MD  Admit date: 01/28/2019 Discharge date: 02/03/2019  Admitted From: Home Disposition:  Home  Recommendations for Outpatient Follow-up:  1. Follow up with PCP in 1-2 weeks 2. Please obtain BMP/CBC in one week     Discharge Condition: Stable CODE STATUS: FULL Diet recommendation: Heart Healthy   Brief/Interim Summary: 73 year old male with history of asthma, chronic back pain, anemia, and GERD presenting with 2-week history of fatigue and malaise. On 01/25/2019, the patient developed a cough and some shortness of breath with worsening fatigue. His cough was mostly nonproductive, but he would occasionally produce some yellow mucus. He denied hemoptysis. He denies any recent travels or sick contacts. He had some nausea without any emesis or diarrhea. He has had poor oral intake. The patient has never smoked. He denied any chest pain, abdominal pain, dysuria, hematuria, sore throat. He did have an occasional frontal headache. He denied any rashes or synovitis. Because of his symptoms, the patient went to the emergency department at [facility].At that time, the patient was noted to have WBC 18.0 with serum creatinine 1.20. The patient was told that he had pneumonia. He was given levofloxacin. There was an attempt to transfer to [facility] long hospital to rule out novel coronavirus infection., but the patient wanted to come to [facility].Nevertheless, the patient was discharged from the ED. His family drove him subsequently to [place].  In the emergency department, the patient had a low-grade temperature nine 9.2 with blood pressure of 84/65. The patient was fluid resuscitated. WBC was 17.0 with lactic acid 2.3. Chest x-ray showed peribronchial thickening without consolidation. He was started on levofloxacin and admitted for further evaluation. CTA chest showed LUL cavitary  consolidation. Pulmonary medicine was consulted to assist with management.   Discharge Diagnoses:  Sepsis -due to pneumonia -Lactic acid peaked 2.3 -Follow blood cultures although these are likely going to be negative as the patient received levofloxacin from the EDin [place] prior to coming to [facility] -Urinalysis negative for pyuria -Check procalcitonin0.25>>>0.15 -Influenza PCR--neg -Viral respiratory panel--neg -Urine Legionella antigen--pending -Urine Streptococcus pneumoniae antigen--neg -continue IVF>>>saline lock -sepsis physiology resolved   Lobar Pneumonia/LUL necrotizing mass -Personally reviewed chest x-ray--peribronchial thickening without consolidation-neg -Influenza PCR--neg -Viral respiratory panel--neg -01/29/19 CTA chest--neg for PE; cavitary lesion apical LUL with adjacent consolidation -favor necrotizing PNA, but cannot r/o malignancy, MTB vs non-tuberculous mycobacteria -appreciate pulmonary consult -Quantiferon--indeterminant -sputum for AFB x 3--negative x 3 -d/c home with levofloxacin x 8 more days to complete 2 weeks of tx -CT chest in one month; follow up with Dr. 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   Ventolin HFA 108 (90 Base) MCG/ACT inhaler Generic drug:  albuterol   ferrous sulfate 325 (65 FE) MG tablet Take 1 tablet (325 mg total) by mouth 2 (two) times daily with a meal.   gabapentin 400 MG capsule Commonly known as:  NEURONTIN Take 1 capsule by mouth 3 (three) times daily.   HYDROcodone-acetaminophen 5-325 MG tablet Commonly known as:  NORCO/VICODIN Take 1 tablet by mouth every 6 (six) hours as needed.   levofloxacin 750 MG tablet Commonly known as:  LEVAQUIN Take 1 tablet (750 mg total) by mouth daily.   vitamin B-12 500 MCG tablet Commonly known as:  CYANOCOBALAMIN Take 1 tablet (500 mcg total) by mouth daily.       Allergies  Allergen Reactions   Keflex [Cephalexin] Other (See Comments)    unknown    Consultations:  pulmonary   Procedures/Studies: Ct Angio Chest Pe W Or Wo Contrast  Result Date: 01/29/2019 CLINICAL DATA:  Elevated D-dimer, shortness of breath EXAM: CT ANGIOGRAPHY CHEST WITH CONTRAST TECHNIQUE: Multidetector CT imaging of the chest was performed using the standard protocol during bolus administration of intravenous contrast. Multiplanar CT image reconstructions and MIPs were obtained to evaluate the vascular anatomy. CONTRAST:  147mL OMNIPAQUE IOHEXOL 350 MG/ML SOLN COMPARISON:  Chest radiographs, 01/28/2019 FINDINGS: Cardiovascular: Satisfactory opacification of the pulmonary arteries to the segmental level. No evidence of pulmonary embolism. Normal heart size. No pericardial effusion. Mediastinum/Nodes: There is soft tissue thickening about the left hilum and enlarged mediastinal and AP window lymph nodes measuring up to 1.2 cm in short axis. Thyroid gland, trachea, and esophagus demonstrate no significant findings. Hiatal hernia.  Lungs/Pleura: There is a cavitary lesion of the apical left upper lobe with adjacent consolidation and volume loss measuring approximately 7.8 x 4.7 by 6.0 cm (series 8, image 33, series 9, image 104). Trace left pleural effusion. There is scattered centrilobular nodularity and ground-glass elsewhere bilaterally, for example in the posterior right upper lobe (series 8, image 57). Upper Abdomen: No acute abnormality. Musculoskeletal: No chest wall abnormality. No acute or significant osseous findings. Review of the MIP images confirms the above findings. IMPRESSION: 1.  Negative examination for pulmonary embolism. 2. There is a cavitary lesion of the apical left upper lobe with adjacent consolidation and volume loss measuring approximately 7.8 x 4.7 by 6.0 cm (series 8, image 33, series 9, image 104). Trace left pleural effusion. There is scattered centrilobular nodularity and ground-glass elsewhere bilaterally, for example in the posterior right upper lobe (series 8, image 57). There is soft tissue thickening about the left hilum and enlarged mediastinal and AP window lymph nodes measuring up to 1.2 cm in short axis. Findings are concerning for malignancy, with differential consideration of cavitary infection, including tuberculosis. Electronically Signed   By: Eddie Candle M.D.   On: 01/29/2019 10:50   Dg Chest Port 1 View  Result Date: 01/28/2019 CLINICAL DATA:  Cough, congestion EXAM: PORTABLE CHEST 1 VIEW COMPARISON:  None. FINDINGS: Heart is borderline in size. Vascular congestion. Peribronchial thickening. No confluent opacities, effusions or edema. No acute bony abnormality. IMPRESSION: Borderline cardiomegaly with vascular congestion. Peribronchial thickening may reflect bronchitis. Electronically Signed   By: Rolm Baptise M.D.   On: 01/28/2019 19:54        Discharge Exam: Vitals:   02/03/19 0809 02/03/19 1438  BP:  101/61  Pulse:  93  Resp:  18  Temp:  98.4 F (36.9 C)  SpO2: 97% 99%    Vitals:   02/02/19 2119 02/03/19 0540 02/03/19 0809 02/03/19 1438  BP: 107/66 105/65  101/61  Pulse: 85 89  93  Resp: 20 17  18   Temp: 98.4 F (36.9 C) 98.2 F (36.8 C)  98.4 F (36.9 C)  TempSrc: Oral Oral  Oral  SpO2: 100% 99% 97% 99%  Weight:      Height:        General: Pt is alert, awake, not in acute distress Cardiovascular: RRR, S1/S2 +, no rubs, no gallops Respiratory: left rales; right CTA Abdominal: Soft, NT, ND, bowel sounds + Extremities: no edema, no cyanosis   The results of significant diagnostics from this hospitalization (including imaging, microbiology, ancillary and laboratory) are listed below for reference.    Significant Diagnostic Studies: Ct Angio Chest Pe W Or Wo Contrast  Result Date: 01/29/2019 CLINICAL DATA:  Elevated D-dimer, shortness of breath EXAM: CT ANGIOGRAPHY CHEST WITH CONTRAST TECHNIQUE: Multidetector CT imaging of the chest was performed using the standard protocol during bolus administration of intravenous contrast. Multiplanar CT image reconstructions and MIPs were obtained to evaluate the vascular anatomy. CONTRAST:  131mL OMNIPAQUE IOHEXOL 350 MG/ML SOLN COMPARISON:  Chest radiographs, 01/28/2019 FINDINGS: Cardiovascular: Satisfactory opacification of the pulmonary arteries to the segmental level. No evidence of pulmonary embolism. Normal heart size. No pericardial effusion. Mediastinum/Nodes: There is soft tissue thickening about the left hilum and enlarged mediastinal and AP window lymph nodes measuring up to 1.2 cm in short axis. Thyroid gland, trachea, and esophagus demonstrate no significant findings. Hiatal hernia. Lungs/Pleura: There is a cavitary lesion of the apical left upper lobe with adjacent consolidation and volume loss measuring approximately 7.8 x 4.7 by 6.0 cm (series 8, image 33, series 9, image 104). Trace left pleural effusion. There is scattered centrilobular nodularity and ground-glass elsewhere bilaterally, for example in  the posterior right upper lobe (series 8, image 57). Upper Abdomen: No acute abnormality. Musculoskeletal: No chest wall abnormality. No acute or significant osseous findings. Review of the MIP images confirms the above findings. IMPRESSION: 1.  Negative examination for pulmonary embolism. 2. There is a cavitary lesion of the apical left upper lobe with adjacent consolidation and volume loss measuring approximately 7.8 x 4.7 by 6.0 cm (series 8, image 33, series 9, image 104). Trace left pleural effusion. There is scattered centrilobular nodularity and ground-glass elsewhere bilaterally, for example in the posterior right upper lobe (series 8, image 57). There is soft tissue thickening about the left hilum and enlarged mediastinal and AP window lymph nodes measuring up to 1.2 cm in short axis. Findings are concerning for malignancy, with differential consideration of cavitary infection, including tuberculosis. Electronically Signed   By: Eddie Candle M.D.   On: 01/29/2019 10:50   Dg Chest Port 1 View  Result Date: 01/28/2019 CLINICAL DATA:  Cough, congestion EXAM: PORTABLE CHEST 1 VIEW COMPARISON:  None. FINDINGS: Heart is borderline in size. Vascular congestion. Peribronchial thickening. No confluent opacities, effusions or edema. No acute bony abnormality. IMPRESSION: Borderline cardiomegaly with vascular congestion. Peribronchial thickening may reflect bronchitis. Electronically Signed   By: Rolm Baptise M.D.   On: 01/28/2019 19:54     Microbiology: Recent Results (from the past 240 hour(s))  SARS Coronavirus 2 (CEPHEID- Performed in [place] hospital lab), Hosp Order     Status: None   Collection Time: 01/28/19  7:35 PM  Result Value Ref Range Status   SARS Coronavirus 2 NEGATIVE NEGATIVE Final  Comment: (NOTE) If result is NEGATIVE SARS-CoV-2 target nucleic acids are NOT DETECTED. The SARS-CoV-2 RNA is generally detectable in upper and lower  respiratory specimens during the acute phase of  infection. The lowest  concentration of SARS-CoV-2 viral copies this assay can detect is 250  copies / mL. A negative result does not preclude SARS-CoV-2 infection  and should not be used as the sole basis for treatment or other  patient management decisions.  A negative result may occur with  improper specimen collection / handling, submission of specimen other  than nasopharyngeal swab, presence of viral mutation(s) within the  areas targeted by this assay, and inadequate number of viral copies  (<250 copies / mL). A negative result must be combined with clinical  observations, patient history, and epidemiological information. If result is POSITIVE SARS-CoV-2 target nucleic acids are DETECTED. The SARS-CoV-2 RNA is generally detectable in upper and lower  respiratory specimens dur ing the acute phase of infection.  Positive  results are indicative of active infection with SARS-CoV-2.  Clinical  correlation with patient history and other diagnostic information is  necessary to determine patient infection status.  Positive results do  not rule out bacterial infection or co-infection with other viruses. If result is PRESUMPTIVE POSTIVE SARS-CoV-2 nucleic acids MAY BE PRESENT.   A presumptive positive result was obtained on the submitted specimen  and confirmed on repeat testing.  While 2019 novel coronavirus  (SARS-CoV-2) nucleic acids may be present in the submitted sample  additional confirmatory testing may be necessary for epidemiological  and / or clinical management purposes  to differentiate between  SARS-CoV-2 and other Sarbecovirus currently known to infect humans.  If clinically indicated additional testing with an alternate test  methodology [phone]) is advised. The SARS-CoV-2 RNA is generally  detectable in upper and lower respiratory sp ecimens during the acute  phase of infection. The expected result is Negative. Fact Sheet for Patients:   StrictlyIdeas.no Fact Sheet for Healthcare Providers: BankingDealers.co.za This test is not yet approved or cleared by the [place] FDA and has been authorized for detection and/or diagnosis of SARS-CoV-2 by FDA under an Emergency Use Authorization (EUA).  This EUA will remain in effect (meaning this test can be used) for the duration of the COVID-19 declaration under Section 564(b)(1) of the Act, 21 U.S.C. section 360bbb-3(b)(1), unless the authorization is terminated or revoked sooner. Performed at [facility], [address], [place], [place] [address]   Culture, blood (Routine x 2)     Status: None   Collection Time: 01/28/19  7:50 PM  Result Value Ref Range Status   Specimen Description BLOOD BLOOD RIGHT WRIST  Final   Special Requests   Final    BOTTLES DRAWN AEROBIC AND ANAEROBIC Blood Culture adequate volume   Culture   Final    NO GROWTH 5 DAYS Performed at [facility], [address] [place], [place] [address]    Report Status 02/02/2019 FINAL  Final  Culture, blood (Routine x 2)     Status: None   Collection Time: 01/28/19  8:00 PM  Result Value Ref Range Status   Specimen Description BLOOD SITE NOT SPECIFIED  Final   Special Requests   Final    BOTTLES DRAWN AEROBIC AND ANAEROBIC Blood Culture adequate volume   Culture   Final    NO GROWTH 5 DAYS Performed at [facility], [address], [place],  [address]    Report Status 02/02/2019 FINAL  Final  MRSA PCR  Screening     Status: None   Collection Time: 01/29/19 12:01 AM  Result Value Ref Range Status   MRSA by PCR NEGATIVE NEGATIVE Final    Comment:        The GeneXpert MRSA Assay (FDA approved for NASAL specimens only), is one component of a comprehensive MRSA colonization surveillance program. It is not intended to diagnose MRSA infection nor to guide or monitor treatment for MRSA infections. Performed at [facility], [address]  [place], [place] [address]   Respiratory Panel by PCR     Status: None   Collection Time: 01/29/19  8:06 AM  Result Value Ref Range Status   Adenovirus NOT DETECTED NOT DETECTED Final   Coronavirus 229E NOT DETECTED NOT DETECTED Final    Comment: (NOTE) The Coronavirus on the Respiratory Panel, DOES NOT test for the novel  Coronavirus (2019 nCoV)    Coronavirus HKU1 NOT DETECTED NOT DETECTED Final   Coronavirus NL63 NOT DETECTED NOT DETECTED Final   Coronavirus OC43 NOT DETECTED NOT DETECTED Final   Metapneumovirus NOT DETECTED NOT DETECTED Final   Rhinovirus / Enterovirus NOT DETECTED NOT DETECTED Final   Influenza A NOT DETECTED NOT DETECTED Final   Influenza B NOT DETECTED NOT DETECTED Final   Parainfluenza Virus 1 NOT DETECTED NOT DETECTED Final   Parainfluenza Virus 2 NOT DETECTED NOT DETECTED Final   Parainfluenza Virus 3 NOT DETECTED NOT DETECTED Final   Parainfluenza Virus 4 NOT DETECTED NOT DETECTED Final   Respiratory Syncytial Virus NOT DETECTED NOT DETECTED Final   Bordetella pertussis NOT DETECTED NOT DETECTED Final   Chlamydophila pneumoniae NOT DETECTED NOT DETECTED Final   Mycoplasma pneumoniae NOT DETECTED NOT DETECTED Final    Comment: Performed at [facility], [place] [address] [place], [place] [address]  Acid Fast Smear (AFB)     Status: None   Collection Time: 01/30/19  6:30 AM  Result Value Ref Range Status   AFB Specimen Processing Concentration  Final   Acid Fast Smear Negative  Final    Comment: (NOTE) Performed At: [facility] [address] [place], [place] [id number] Rush Farmer MD [id number]    Source (AFB) TRACHEAL ASPIRATE  Final    Comment: Performed at [facility], [address] [place], [place] [address]  Culture, respiratory     Status: None   Collection Time: 01/30/19  6:32 AM  Result Value Ref Range Status   Specimen Description   Final    TRACHEAL ASPIRATE Performed at [facility], [address] [place], [place]  [address]    Special Requests   Final    NONE Performed at [facility], [address] [place], [place] [address]    Gram Stain   Final    NO WBC SEEN RARE GRAM POSITIVE COCCI IN PAIRS RARE GRAM VARIABLE ROD RARE SQUAMOUS EPITHELIAL CELLS PRESENT    Culture   Final    FEW Consistent with normal respiratory flora. Performed at [facility], [place] [address], [place],  [address]    Report Status 02/01/2019 FINAL  Final  Acid Fast Smear (AFB)     Status: None   Collection Time: 01/31/19  6:30 AM  Result Value Ref Range Status   AFB Specimen Processing Concentration  Final   Acid Fast Smear Negative  Final    Comment: (NOTE) Performed At: [facility] [address] [place], [place] [id number] Rush Farmer MD [id number]    Source (AFB) SPU  Final    Comment: Performed at [facility], [address], [place], [place] [address]  Acid Fast Smear (AFB)     Status: None   Collection Time: 01/31/19  4:05 PM  Result Value Ref Range Status   AFB Specimen Processing Concentration  Final   Acid Fast Smear Negative  Final    Comment: (NOTE) Performed At: [facility] [place], [place] [id number] Rush Farmer MD QI:[id number]    Source (AFB) SPU  Final    Comment: Performed at [facility], [address] [place], [place] [address]     Labs: Basic Metabolic Panel: Recent Labs  Lab 01/29/19 0426 01/30/19 0555 01/31/19 0501 02/01/19 0527 02/03/19 0548  NA 137 138 138 138 138  K 3.9 3.6 3.4* 3.3* 4.0  CL 107 108 105 104 104  CO2 21* 23 22 24 26   GLUCOSE 104* 102* 103* 139* 99  BUN 18 15 14 13 15   CREATININE 1.13 0.98 1.03 1.04 1.17  CALCIUM 8.1* 8.4* 8.5* 8.5* 8.8*  MG  --   --   --  1.9  --   PHOS  --   --   --  3.5  --    Liver Function Tests: Recent Labs  Lab 01/28/19 1949 01/29/19 0426  AST 17 13*  ALT 14 12  ALKPHOS 104 89  BILITOT 0.9 0.9  PROT 6.9 6.1*  ALBUMIN 2.4* 2.0*   No results for input(s): LIPASE,  AMYLASE in the last 168 hours. No results for input(s): AMMONIA in the last 168 hours. CBC: Recent Labs  Lab 01/28/19 1949 01/29/19 0426 01/30/19 0555 01/31/19 0501 02/01/19 0527 02/03/19 0548  WBC 17.0* 14.7* 10.6* 10.3 9.7 11.9*  NEUTROABS 14.6*  --   --  7.9*  --   --   HGB 9.5* 8.5* 8.6* 8.8* 8.9* 9.7*  HCT 30.3* 27.9*   25.6* 28.5* 29.1* 29.5* 31.9*  MCV 87.3 88.0 90.2 88.7 88.1 87.9  PLT 465* 390 371 382 392 478*   Cardiac Enzymes: Recent Labs  Lab 01/28/19 1949 01/30/19 0555 01/30/19 1137 01/30/19 1644  TROPONINI <0.03 <0.03 <0.03 <0.03   BNP: Invalid input(s): POCBNP CBG: No results for input(s): GLUCAP in the last 168 hours.  Time coordinating discharge:  36 minutes  Signed:  Orson Eva, DO [facility] Pager: [phone] 02/03/2019, 5:47 PM

## 2019-02-01 NOTE — Progress Notes (Signed)
PROGRESS NOTE  Evan Price SEG:[id number] DOB: 03-07-1946 DOA: 01/28/2019 PCP: Donalynn Furlong, MD  Brief History: 73 year old male with history of asthma, chronic back pain, anemia, and GERD presenting with 2-week history of fatigue and malaise. On 01/25/2019, the patient developed a cough and some shortness of breath with worsening fatigue. His cough was mostly nonproductive, but he would occasionally produce some yellow mucus. He denied hemoptysis. He denies any recent travels or sick contacts. He had some nausea without any emesis or diarrhea. He has had poor oral intake. The patient has never smoked. He denied any chest pain, abdominal pain, dysuria, hematuria, sore throat. He did have an occasional frontal headache. He denied any rashes or synovitis. Because of his symptoms, the patient went to the emergency department at [facility].At that time, the patient was noted to have WBC 18.0 with serum creatinine 1.20. The patient was told that he had pneumonia. He was given levofloxacin. There was an attempt to transfer to [facility] long hospital to rule out novel coronavirus infection., but the patient wanted to come to [facility].Nevertheless, the patient was discharged from the ED. His family drove him subsequently to [place].  In the emergency department, the patient had a low-grade temperature nine 9.2 with blood pressure of 84/65. The patient was fluid resuscitated. WBC was 17.0 with lactic acid 2.3. Chest x-ray showed peribronchial thickening without consolidation. He was started on levofloxacin and admitted for further evaluation.  CTA chest showed LUL cavitary consolidation.  Pulmonary medicine was consulted to assist with management.  Assessment/Plan: Sepsis -due to pneumonia -Lactic acid peaked 2.3 -Follow blood cultures although these are likely going to be negative as the patient received levofloxacin from the EDin [place] prior to coming to [facility] -Urinalysis  negative for pyuria -Check procalcitonin0.25>>>0.15 -Influenza PCR--neg -Viral respiratory panel--neg -Urine Legionella antigen--pending -Urine Streptococcus pneumoniae antigen--neg -continue IVF -sepsis physiology resolved -am CBC  Lobar Pneumonia/LUL necrotizing mass -Personally reviewed chest x-ray--peribronchial thickening without consolidation-neg -Influenza PCR--neg -Viral respiratory panel--neg -01/29/19 CTA chest--neg for PE; cavitary lesion apical LUL with adjacent consolidation -favor necrotizing PNA, but cannot r/o malignancy, MTB vs non-tuberculous mycobacteria -appreciate pulmonary consult -Quantiferon--indeterminant -sputum for AFB x 3--pending  Iron deficiency anemia -Holding on repletion of iron until etiology of infectious process clarified  B12 deficiency -Started supplementation  Elevated d-dimer -CT angiogram chest as above -personally reviewed EKG--sinus tachycardia, no STT changes  Asthma -Stable without wheezing -Albuterol nebulizer as needed shortness of breath  Chronic back pain -Continue home dose gabapentin and hydrocodone  CKD stage II -Baseline creatinine 1.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   Objective: Vitals:   02/01/19 1100 02/01/19 1106 02/01/19 1200 02/01/19 1300  BP:   107/67   Pulse: 85 87 93 99  Resp: 19 (!) 27 15 16   Temp:  (!) 97.5 F (36.4 C)    TempSrc:  Oral    SpO2: 99% 98% 98% 100%  Weight:      Height:         Intake/Output Summary (Last 24 hours) at 02/01/2019  [place] filed at 02/01/2019 0952 Gross per 24 hour  Intake 1145.14 ml  Output 1200 ml  Net -54.86 ml   Weight change:  Exam:   General:  Pt is alert, follows commands appropriately, not in acute distress  HEENT: No icterus, No thrush, No neck mass, [place]/AT  Cardiovascular: RRR, S1/S2, no rubs, no gallops  Respiratory: R--CTA;  L=apical rales  Abdomen: Soft/+BS, non tender, non distended, no guarding  Extremities: No edema, No lymphangitis, No petechiae, No rashes, no synovitis   Data Reviewed: I have personally reviewed following labs and imaging studies Basic Metabolic Panel: Recent Labs  Lab 01/28/19 1949 01/29/19 0426 01/30/19 0555 01/31/19 0501 02/01/19 0527  NA 136 137 138 138 138  K 3.9 3.9 3.6 3.4* 3.3*  CL 102 107 108 105 104  CO2 22 21* 23 22 24   GLUCOSE 104* 104* 102* 103* 139*  BUN 19 18 15 14 13   CREATININE 1.14 1.13 0.98 1.03 1.04  CALCIUM 8.6* 8.1* 8.4* 8.5* 8.5*  MG  --   --   --   --  1.9  PHOS  --   --   --   --  3.5   Liver Function Tests: Recent Labs  Lab 01/28/19 1949 01/29/19 0426  AST 17 13*  ALT 14 12  ALKPHOS 104 89  BILITOT 0.9 0.9  PROT 6.9 6.1*  ALBUMIN 2.4* 2.0*   No results for input(s): LIPASE, AMYLASE in the last 168 hours. No results for input(s): AMMONIA in the last 168 hours. Coagulation Profile: Recent Labs  Lab 01/28/19 1949  INR 1.4*   CBC: Recent Labs  Lab 01/28/19 1949 01/29/19 0426 01/30/19 0555 01/31/19 0501 02/01/19 0527  WBC 17.0* 14.7* 10.6* 10.3 9.7  NEUTROABS 14.6*  --   --  7.9*  --   HGB 9.5* 8.5* 8.6* 8.8* 8.9*  HCT 30.3* 27.9*  25.6* 28.5* 29.1* 29.5*  MCV 87.3 88.0 90.2 88.7 88.1  PLT 465* 390 371 382 392   Cardiac Enzymes: Recent Labs  Lab 01/28/19 1949 01/30/19 0555 01/30/19 1137 01/30/19 1644  TROPONINI <0.03 <0.03 <0.03 <0.03   BNP: Invalid input(s): POCBNP CBG: No results for input(s): GLUCAP in the last 168 hours.  HbA1C: No results for input(s): HGBA1C in the last 72 hours. Urine analysis:    Component Value Date/Time   COLORURINE AMBER (A) 01/28/2019 1845   APPEARANCEUR CLEAR 01/28/2019 1845   LABSPEC 1.018 01/28/2019 1845   PHURINE 5.0 01/28/2019 1845   GLUCOSEU NEGATIVE 01/28/2019 1845   HGBUR SMALL (A) 01/28/2019 1845   BILIRUBINUR NEGATIVE 01/28/2019 1845   KETONESUR 20 (A) 01/28/2019 1845   PROTEINUR NEGATIVE 01/28/2019 1845   NITRITE NEGATIVE 01/28/2019 1845   LEUKOCYTESUR NEGATIVE 01/28/2019 1845   Sepsis Labs: @LABRCNTIP (procalcitonin:4,lacticidven:4) ) Recent Results (from the past 240 hour(s))  SARS Coronavirus 2 (CEPHEID- Performed in [place] hospital lab), Hosp Order     Status: None   Collection Time: 01/28/19  7:35 PM  Result Value Ref Range Status   SARS Coronavirus 2 NEGATIVE NEGATIVE Final    Comment: (NOTE) If result is NEGATIVE SARS-CoV-2 target nucleic acids are NOT DETECTED. The SARS-CoV-2 RNA is generally detectable in upper and lower  respiratory specimens during the acute phase of infection. The lowest  concentration of SARS-CoV-2 viral copies this assay can detect is 250  copies / mL. A negative result does not preclude SARS-CoV-2 infection  and should not be used as the sole basis for treatment or other  patient management decisions.  A negative result may occur with  improper specimen collection / handling, submission of specimen other  than nasopharyngeal swab, presence of viral mutation(s) within the  areas targeted by this assay, and inadequate number of viral copies  (<250 copies / mL). A negative result must be combined with clinical  observations, patient history, and epidemiological information. If result is POSITIVE SARS-CoV-2 target nucleic acids are DETECTED. The SARS-CoV-2 RNA is generally detectable in upper and lower  respiratory specimens dur ing the acute phase of infection.  Positive  results are indicative of active infection with  SARS-CoV-2.  Clinical  correlation with patient history and other diagnostic information is  necessary to determine patient infection status.  Positive results do  not rule out bacterial infection or co-infection with other viruses. If result is PRESUMPTIVE POSTIVE SARS-CoV-2 nucleic acids MAY BE PRESENT.   A presumptive positive result was obtained on the submitted specimen  and confirmed on repeat testing.  While 2019 novel coronavirus  (SARS-CoV-2) nucleic acids may be present in the submitted sample  additional confirmatory testing may be necessary for epidemiological  and / or clinical management purposes  to differentiate between  SARS-CoV-2 and other Sarbecovirus currently known to infect humans.  If clinically indicated additional testing with an alternate test  methodology [id number]) is advised. The SARS-CoV-2 RNA is generally  detectable in upper and lower respiratory sp ecimens during the acute  phase of infection. The expected result is Negative. Fact Sheet for Patients:  StrictlyIdeas.no Fact Sheet for Healthcare Providers: BankingDealers.co.za This test is not yet approved or cleared by the [place] FDA and has been authorized for detection and/or diagnosis of SARS-CoV-2 by FDA under an Emergency Use Authorization (EUA).  This EUA will remain in effect (meaning this test can be used) for the duration of the COVID-19 declaration under Section 564(b)(1) of the Act, 21 U.S.C. section 360bbb-3(b)(1), unless the authorization is terminated or revoked sooner. Performed at [facility], [address], [place], [place] [address]   Culture, blood (Routine x 2)     Status: None (Preliminary result)   Collection Time: 01/28/19  7:50 PM  Result Value Ref Range Status   Specimen Description BLOOD BLOOD RIGHT WRIST  Final   Special Requests   Final    BOTTLES DRAWN AEROBIC AND ANAEROBIC Blood Culture adequate volume   Culture    Final    NO GROWTH 4 DAYS Performed at [facility], [address], [place], [place] [address]    Report Status PENDING  Incomplete  Culture, blood (Routine x 2)     Status: None (Preliminary result)   Collection Time: 01/28/19  8:00 PM  Result Value Ref Range Status   Specimen Description BLOOD SITE NOT SPECIFIED  Final   Special Requests   Final    BOTTLES DRAWN AEROBIC AND ANAEROBIC Blood Culture adequate volume   Culture   Final    NO GROWTH 4 DAYS Performed at [facility], [address] [place], [place] [address]    Report Status PENDING  Incomplete  MRSA PCR Screening     Status: None   Collection Time: 01/29/19 12:01 AM  Result Value Ref Range Status   MRSA by PCR NEGATIVE NEGATIVE Final    Comment:        The GeneXpert MRSA Assay (FDA approved for NASAL specimens only), is one component of a comprehensive MRSA colonization surveillance program. It is not intended to diagnose MRSA infection nor to guide or  monitor treatment for MRSA infections. Performed at [facility], [address], [place], [place] [address]   Respiratory Panel by PCR     Status: None   Collection Time: 01/29/19  8:06 AM  Result Value Ref Range Status   Adenovirus NOT DETECTED NOT DETECTED Final   Coronavirus 229E NOT DETECTED NOT DETECTED Final    Comment: (NOTE) The Coronavirus on the Respiratory Panel, DOES NOT test for the novel  Coronavirus (2019 nCoV)    Coronavirus HKU1 NOT DETECTED NOT DETECTED Final   Coronavirus NL63 NOT DETECTED NOT DETECTED Final   Coronavirus OC43 NOT DETECTED NOT DETECTED Final   Metapneumovirus NOT DETECTED NOT DETECTED Final   Rhinovirus / Enterovirus NOT DETECTED NOT DETECTED Final   Influenza A NOT DETECTED NOT DETECTED Final   Influenza B NOT DETECTED NOT DETECTED Final   Parainfluenza Virus 1 NOT DETECTED NOT DETECTED Final   Parainfluenza Virus 2 NOT DETECTED NOT DETECTED Final   Parainfluenza Virus 3 NOT DETECTED NOT DETECTED Final   Parainfluenza  Virus 4 NOT DETECTED NOT DETECTED Final   Respiratory Syncytial Virus NOT DETECTED NOT DETECTED Final   Bordetella pertussis NOT DETECTED NOT DETECTED Final   Chlamydophila pneumoniae NOT DETECTED NOT DETECTED Final   Mycoplasma pneumoniae NOT DETECTED NOT DETECTED Final    Comment: Performed at [facility], [place] [address], [place], [place] [address]  Culture, respiratory     Status: None   Collection Time: 01/30/19  6:32 AM  Result Value Ref Range Status   Specimen Description   Final    TRACHEAL ASPIRATE Performed at [facility], [address] [place], [place] [address]    Special Requests   Final    NONE Performed at [facility], [address] [place], [place] [address]    Gram Stain   Final    NO WBC SEEN RARE GRAM POSITIVE COCCI IN PAIRS RARE GRAM VARIABLE ROD RARE SQUAMOUS EPITHELIAL CELLS PRESENT    Culture   Final    FEW Consistent with normal respiratory flora. Performed at [facility], [place] [address] [place], [place] [address]    Report Status 02/01/2019 FINAL  Final     Scheduled Meds: . Chlorhexidine Gluconate Cloth  6 each Topical Daily  . feeding supplement (PRO-STAT SUGAR FREE 64)  30 mL Oral BID  . gabapentin  400 mg Oral TID  . sodium chloride flush  10-40 mL Intracatheter Q12H  . vitamin B-12  500 mcg Oral Daily   Continuous Infusions: . clindamycin (CLEOCIN) IV 600 mg (02/01/19 0952)  . levofloxacin (LEVAQUIN) IV Stopped (01/31/19 2339)    Procedures/Studies: Ct Angio Chest Pe W Or Wo Contrast  Result Date: 01/29/2019 CLINICAL DATA:  Elevated D-dimer, shortness of breath EXAM: CT ANGIOGRAPHY CHEST WITH CONTRAST TECHNIQUE: Multidetector CT imaging of the chest was performed using the standard protocol during bolus administration of intravenous contrast. Multiplanar CT image reconstructions and MIPs were obtained to evaluate the vascular anatomy. CONTRAST:  15mL OMNIPAQUE IOHEXOL 350 MG/ML SOLN COMPARISON:  Chest radiographs, 01/28/2019  FINDINGS: Cardiovascular: Satisfactory opacification of the pulmonary arteries to the segmental level. No evidence of pulmonary embolism. Normal heart size. No pericardial effusion. Mediastinum/Nodes: There is soft tissue thickening about the left hilum and enlarged mediastinal and AP window lymph nodes measuring up to 1.2 cm in short axis. Thyroid gland, trachea, and esophagus demonstrate no significant findings. Hiatal hernia. Lungs/Pleura: There is a cavitary lesion of the apical left upper lobe with adjacent  consolidation and volume loss measuring approximately 7.8 x 4.7 by 6.0 cm (series 8, image 33, series 9, image 104). Trace left pleural effusion. There is scattered centrilobular nodularity and ground-glass elsewhere bilaterally, for example in the posterior right upper lobe (series 8, image 57). Upper Abdomen: No acute abnormality. Musculoskeletal: No chest wall abnormality. No acute or significant osseous findings. Review of the MIP images confirms the above findings. IMPRESSION: 1.  Negative examination for pulmonary embolism. 2. There is a cavitary lesion of the apical left upper lobe with adjacent consolidation and volume loss measuring approximately 7.8 x 4.7 by 6.0 cm (series 8, image 33, series 9, image 104). Trace left pleural effusion. There is scattered centrilobular nodularity and ground-glass elsewhere bilaterally, for example in the posterior right upper lobe (series 8, image 57). There is soft tissue thickening about the left hilum and enlarged mediastinal and AP window lymph nodes measuring up to 1.2 cm in short axis. Findings are concerning for malignancy, with differential consideration of cavitary infection, including tuberculosis. Electronically Signed   By: Eddie Candle M.D.   On: 01/29/2019 10:50   Dg Chest Port 1 View  Result Date: 01/28/2019 CLINICAL DATA:  Cough, congestion EXAM: PORTABLE CHEST 1 VIEW COMPARISON:  None. FINDINGS: Heart is borderline in size. Vascular congestion.  Peribronchial thickening. No confluent opacities, effusions or edema. No acute bony abnormality. IMPRESSION: Borderline cardiomegaly with vascular congestion. Peribronchial thickening may reflect bronchitis. Electronically Signed   By: Rolm Baptise M.D.   On: 01/28/2019 19:54    Orson Eva, DO  [facility] Pager [phone]  If 7PM-7AM, please contact night-coverage [email or web address] Password [email or web address] 02/01/2019, 3:16 PM   LOS: 4 days

## 2019-02-01 NOTE — Progress Notes (Signed)
Subjective: He says he feels about the same.  AFB testing is underway but no results are back yet.  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  Objective: Vital signs in last 24 hours: Temp:  [97.2 F (36.2 C)-98.2 F (36.8 C)] 97.2 F (36.2 C) (05/10 0728) Pulse Rate:  [76-98] 76 (05/10 0728) Resp:  [13-26] 20 (05/10 0728) BP: (96-117)/(40-67) 105/40 (05/10 0400) SpO2:  [96 %-100 %] 96 % (05/10 0728) Weight change:  Last BM Date: 01/29/19  Intake/Output from previous day: 05/09 0701 - 05/10 0700 In: 945.1 [P.O.:480; IV Piggyback:465.1] Out: 750 [Urine:750]  PHYSICAL EXAM General appearance: alert, cooperative and no distress Resp: clear to auscultation bilaterally Cardio: regular rate and rhythm, S1, S2 normal, no murmur, click, rub or gallop GI: soft, non-tender; bowel sounds normal; no masses,  no organomegaly Extremities: extremities normal, atraumatic, no cyanosis or edema  Lab Results:  Results for orders placed or performed during the hospital encounter of 01/28/19 (from the past 48 hour(s))  Troponin I - Now Then Q6H     Status: None   Collection Time: 01/30/19 11:37 AM  Result Value Ref Range   Troponin I <0.03 <0.03 ng/mL    Comment: Performed at [facility], [address], [place], [place] [address]  Troponin I - Now Then Q6H     Status: None   Collection Time: 01/30/19  4:44 PM  Result Value Ref Range   Troponin I <0.03 <0.03 ng/mL    Comment: Performed at [facility], [address] [place], [place] [address]  Procalcitonin     Status: None   Collection Time: 01/31/19  5:01 AM  Result Value Ref Range   Procalcitonin 0.14 ng/mL    Comment:        Interpretation: PCT (Procalcitonin) <= 0.5 ng/mL: Systemic infection (sepsis) is not likely. Local bacterial infection is possible. (NOTE)       Sepsis PCT Algorithm           Lower Respiratory Tract                                      Infection PCT Algorithm     ----------------------------     ----------------------------         PCT < 0.25 ng/mL                PCT < 0.10 ng/mL         Strongly encourage             Strongly discourage   discontinuation of antibiotics    initiation of antibiotics    ----------------------------     -----------------------------       PCT 0.25 - 0.50 ng/mL            PCT 0.10 - 0.25 ng/mL               OR       >80% decrease in PCT            Discourage initiation of                                            antibiotics      Encourage discontinuation           of  antibiotics    ----------------------------     -----------------------------         PCT >= 0.50 ng/mL              PCT 0.26 - 0.50 ng/mL               AND        <80% decrease in PCT             Encourage initiation of                                             antibiotics       Encourage continuation           of antibiotics    ----------------------------     -----------------------------        PCT >= 0.50 ng/mL                  PCT > 0.50 ng/mL               AND         increase in PCT                  Strongly encourage                                      initiation of antibiotics    Strongly encourage escalation           of antibiotics                                     -----------------------------                                           PCT <= 0.25 ng/mL                                                 OR                                        > 80% decrease in PCT                                     Discontinue / Do not initiate                                             antibiotics Performed at [facility], [address] [place], [place] [address]   Basic metabolic panel     Status: Abnormal   Collection Time: 01/31/19  5:01 AM  Result Value Ref Range   Sodium 138 135 - 145 mmol/L   Potassium 3.4 (L)  3.5 - 5.1 mmol/L   Chloride 105 98 - 111 mmol/L   CO2 22 22 - 32 mmol/L   Glucose, Bld 103 (H) 70 - 99 mg/dL   BUN 14 8 -  23 mg/dL   Creatinine, Ser 1.03 0.61 - 1.24 mg/dL   Calcium 8.5 (L) 8.9 - 10.3 mg/dL   GFR calc non Af Amer >60 >60 mL/min   GFR calc Af Amer >60 >60 mL/min   Anion gap 11 5 - 15    Comment: Performed at [facility], [address], [place], [place] [address]  CBC with Differential/Platelet     Status: Abnormal   Collection Time: 01/31/19  5:01 AM  Result Value Ref Range   WBC 10.3 4.0 - 10.5 K/uL   RBC 3.28 (L) 4.22 - 5.81 MIL/uL   Hemoglobin 8.8 (L) 13.0 - 17.0 g/dL   HCT 29.1 (L) 39.0 - 52.0 %   MCV 88.7 80.0 - 100.0 fL   MCH 26.8 26.0 - 34.0 pg   MCHC 30.2 30.0 - 36.0 g/dL   RDW 14.7 11.5 - 15.5 %   Platelets 382 150 - 400 K/uL   nRBC 0.0 0.0 - 0.2 %   Neutrophils Relative % 77 %   Neutro Abs 7.9 (H) 1.7 - 7.7 K/uL   Lymphocytes Relative 10 %   Lymphs Abs 1.1 0.7 - 4.0 K/uL   Monocytes Relative 9 %   Monocytes Absolute 0.9 0.1 - 1.0 K/uL   Eosinophils Relative 3 %   Eosinophils Absolute 0.3 0.0 - 0.5 K/uL   Basophils Relative 0 %   Basophils Absolute 0.0 0.0 - 0.1 K/uL   Immature Granulocytes 1 %   Abs Immature Granulocytes 0.11 (H) 0.00 - 0.07 K/uL    Comment: Performed at [facility], [address] [place], [place] [address]  Basic metabolic panel     Status: Abnormal   Collection Time: 02/01/19  5:27 AM  Result Value Ref Range   Sodium 138 135 - 145 mmol/L   Potassium 3.3 (L) 3.5 - 5.1 mmol/L   Chloride 104 98 - 111 mmol/L   CO2 24 22 - 32 mmol/L   Glucose, Bld 139 (H) 70 - 99 mg/dL   BUN 13 8 - 23 mg/dL   Creatinine, Ser 1.04 0.61 - 1.24 mg/dL   Calcium 8.5 (L) 8.9 - 10.3 mg/dL   GFR calc non Af Amer >60 >60 mL/min   GFR calc Af Amer >60 >60 mL/min   Anion gap 10 5 - 15    Comment: Performed at [facility], [address], [place], [place] [address]  Magnesium     Status: None   Collection Time: 02/01/19  5:27 AM  Result Value Ref Range   Magnesium 1.9 1.7 - 2.4 mg/dL    Comment: Performed at [facility], [address], [place], [place] [address]   Phosphorus     Status: None   Collection Time: 02/01/19  5:27 AM  Result Value Ref Range   Phosphorus 3.5 2.5 - 4.6 mg/dL    Comment: Performed at [facility], [address] [place], [place] [address]  CBC     Status: Abnormal   Collection Time: 02/01/19  5:27 AM  Result Value Ref Range   WBC 9.7 4.0 - 10.5 K/uL   RBC 3.35 (L) 4.22 - 5.81 MIL/uL   Hemoglobin 8.9 (L) 13.0 - 17.0 g/dL   HCT 29.5 (L) 39.0 - 52.0 %   MCV 88.1 80.0 - 100.0  fL   MCH 26.6 26.0 - 34.0 pg   MCHC 30.2 30.0 - 36.0 g/dL   RDW 14.7 11.5 - 15.5 %   Platelets 392 150 - 400 K/uL   nRBC 0.0 0.0 - 0.2 %    Comment: Performed at [facility], [address] [place], [place] [address]    ABGS No results for input(s): PHART, PO2ART, TCO2, HCO3 in the last 72 hours.  Invalid input(s): PCO2 CULTURES Recent Results (from the past 240 hour(s))  SARS Coronavirus 2 (CEPHEID- Performed in [place] hospital lab), Hosp Order     Status: None   Collection Time: 01/28/19  7:35 PM  Result Value Ref Range Status   SARS Coronavirus 2 NEGATIVE NEGATIVE Final    Comment: (NOTE) If result is NEGATIVE SARS-CoV-2 target nucleic acids are NOT DETECTED. The SARS-CoV-2 RNA is generally detectable in upper and lower  respiratory specimens during the acute phase of infection. The lowest  concentration of SARS-CoV-2 viral copies this assay can detect is 250  copies / mL. A negative result does not preclude SARS-CoV-2 infection  and should not be used as the sole basis for treatment or other  patient management decisions.  A negative result may occur with  improper specimen collection / handling, submission of specimen other  than nasopharyngeal swab, presence of viral mutation(s) within the  areas targeted by this assay, and inadequate number of viral copies  (<250 copies / mL). A negative result must be combined with clinical  observations, patient history, and epidemiological information. If result is POSITIVE SARS-CoV-2  target nucleic acids are DETECTED. The SARS-CoV-2 RNA is generally detectable in upper and lower  respiratory specimens dur ing the acute phase of infection.  Positive  results are indicative of active infection with SARS-CoV-2.  Clinical  correlation with patient history and other diagnostic information is  necessary to determine patient infection status.  Positive results do  not rule out bacterial infection or co-infection with other viruses. If result is PRESUMPTIVE POSTIVE SARS-CoV-2 nucleic acids MAY BE PRESENT.   A presumptive positive result was obtained on the submitted specimen  and confirmed on repeat testing.  While 2019 novel coronavirus  (SARS-CoV-2) nucleic acids may be present in the submitted sample  additional confirmatory testing may be necessary for epidemiological  and / or clinical management purposes  to differentiate between  SARS-CoV-2 and other Sarbecovirus currently known to infect humans.  If clinically indicated additional testing with an alternate test  methodology [phone]) is advised. The SARS-CoV-2 RNA is generally  detectable in upper and lower respiratory sp ecimens during the acute  phase of infection. The expected result is Negative. Fact Sheet for Patients:  StrictlyIdeas.no Fact Sheet for Healthcare Providers: BankingDealers.co.za This test is not yet approved or cleared by the [place] FDA and has been authorized for detection and/or diagnosis of SARS-CoV-2 by FDA under an Emergency Use Authorization (EUA).  This EUA will remain in effect (meaning this test can be used) for the duration of the COVID-19 declaration under Section 564(b)(1) of the Act, 21 U.S.C. section 360bbb-3(b)(1), unless the authorization is terminated or revoked sooner. Performed at [facility], [address], [place],  [address]   Culture, blood (Routine x 2)     Status: None (Preliminary result)   Collection  Time: 01/28/19  7:50 PM  Result Value Ref Range Status   Specimen Description BLOOD BLOOD RIGHT WRIST  Final   Special Requests   Final    BOTTLES DRAWN  AEROBIC AND ANAEROBIC Blood Culture adequate volume   Culture   Final    NO GROWTH 4 DAYS Performed at [facility], [address] [place], [place] [address]    Report Status PENDING  Incomplete  Culture, blood (Routine x 2)     Status: None (Preliminary result)   Collection Time: 01/28/19  8:00 PM  Result Value Ref Range Status   Specimen Description BLOOD SITE NOT SPECIFIED  Final   Special Requests   Final    BOTTLES DRAWN AEROBIC AND ANAEROBIC Blood Culture adequate volume   Culture   Final    NO GROWTH 4 DAYS Performed at [facility], [address], [place], [place] [address]    Report Status PENDING  Incomplete  MRSA PCR Screening     Status: None   Collection Time: 01/29/19 12:01 AM  Result Value Ref Range Status   MRSA by PCR NEGATIVE NEGATIVE Final    Comment:        The GeneXpert MRSA Assay (FDA approved for NASAL specimens only), is one component of a comprehensive MRSA colonization surveillance program. It is not intended to diagnose MRSA infection nor to guide or monitor treatment for MRSA infections. Performed at [facility], [address], [place], [place] [address]   Respiratory Panel by PCR     Status: None   Collection Time: 01/29/19  8:06 AM  Result Value Ref Range Status   Adenovirus NOT DETECTED NOT DETECTED Final   Coronavirus 229E NOT DETECTED NOT DETECTED Final    Comment: (NOTE) The Coronavirus on the Respiratory Panel, DOES NOT test for the novel  Coronavirus (2019 nCoV)    Coronavirus HKU1 NOT DETECTED NOT DETECTED Final   Coronavirus NL63 NOT DETECTED NOT DETECTED Final   Coronavirus OC43 NOT DETECTED NOT DETECTED Final   Metapneumovirus NOT DETECTED NOT DETECTED Final   Rhinovirus / Enterovirus NOT DETECTED NOT DETECTED Final   Influenza A NOT DETECTED NOT DETECTED Final   Influenza  B NOT DETECTED NOT DETECTED Final   Parainfluenza Virus 1 NOT DETECTED NOT DETECTED Final   Parainfluenza Virus 2 NOT DETECTED NOT DETECTED Final   Parainfluenza Virus 3 NOT DETECTED NOT DETECTED Final   Parainfluenza Virus 4 NOT DETECTED NOT DETECTED Final   Respiratory Syncytial Virus NOT DETECTED NOT DETECTED Final   Bordetella pertussis NOT DETECTED NOT DETECTED Final   Chlamydophila pneumoniae NOT DETECTED NOT DETECTED Final   Mycoplasma pneumoniae NOT DETECTED NOT DETECTED Final    Comment: Performed at [facility], [place] [address] [place], [place] [address]  Culture, respiratory     Status: None (Preliminary result)   Collection Time: 01/30/19  6:32 AM  Result Value Ref Range Status   Specimen Description   Final    TRACHEAL ASPIRATE Performed at [facility], [address] [place], [place] [address]    Special Requests   Final    NONE Performed at [facility], [address], [place], [place] [address]    Gram Stain   Final    NO WBC SEEN RARE GRAM POSITIVE COCCI IN PAIRS RARE GRAM VARIABLE ROD RARE SQUAMOUS EPITHELIAL CELLS PRESENT    Culture   Final    CULTURE REINCUBATED FOR BETTER GROWTH Performed at [facility], [place] [address], [place], [place] [address]    Report Status PENDING  Incomplete   Studies/Results: No results found.  Medications:  Prior to Admission:  Medications Prior to Admission  Medication Sig Dispense Refill Last  Dose  . albuterol (PROVENTIL) (2.5 MG/3ML) 0.083% nebulizer solution Take 2.5 mg by nebulization every 6 (six) hours as needed for wheezing or shortness of breath.   6 01/28/2019 at Unknown time  . gabapentin (NEURONTIN) 400 MG capsule Take 1 capsule by mouth 3 (three) times daily.  1 01/28/2019 at Unknown time  . HYDROcodone-acetaminophen (NORCO/VICODIN) 5-325 MG tablet Take 1 tablet by mouth every 6 (six) hours as needed.  0 01/28/2019 at Unknown time  . VENTOLIN HFA 108 (90 Base) MCG/ACT inhaler   3 01/28/2019 at Unknown time   . chlorproMAZINE (THORAZINE) 25 MG tablet Take 1 tablet (25 mg total) by mouth 3 (three) times daily as needed for up to 3 days for hiccoughs. 9 tablet 0    Scheduled: . Chlorhexidine Gluconate Cloth  6 each Topical Daily  . feeding supplement (PRO-STAT SUGAR FREE 64)  30 mL Oral BID  . gabapentin  400 mg Oral TID  . sodium chloride flush  10-40 mL Intracatheter Q12H  . vitamin B-12  500 mcg Oral Daily   Continuous: . clindamycin (CLEOCIN) IV 600 mg (02/01/19 0343)  . levofloxacin (LEVAQUIN) IV Stopped (01/31/19 2339)   GYF:[id number], albuterol, guaiFENesin, HYDROcodone-acetaminophen, sodium chloride flush, sodium chloride HYPERTONIC  Assesment: He was admitted with sepsis due to pneumonia.  He has a cavity in the area of pneumonia and there is concern about TB.  AFB smears are pending.  He is improving.  Sepsis pathophysiology has resolved  He has asthma at baseline and says his breathing is pretty good  He has mild hypokalemia which is being replaced  He has anemia which appears to be stable and not at a level that would require blood transfusion  Principal Problem:   Hypotension Active Problems:   CAP (community acquired pneumonia)   Sepsis ([place])   Chronic back pain   Anemia   Thrombocytosis ([facility])   Severe protein-calorie malnutrition ([place])   Sepsis due to undetermined organism ([place])   Iron deficiency anemia due to chronic blood loss   Iron deficiency anemia   CKD (chronic kidney disease) stage 2, GFR 60-89 ml/min   Lobar pneumonia ([place])    Plan: Await AFB smears    LOS: 4 days   Alonza Bogus 02/01/2019, 8:51 AM

## 2019-02-01 NOTE — Progress Notes (Signed)
Hypertonic saline given for sputum induction specimen to lab.

## 2019-02-02 LAB — CULTURE, BLOOD (ROUTINE X 2)
Culture: NO GROWTH
Culture: NO GROWTH
Special Requests: ADEQUATE
Special Requests: ADEQUATE

## 2019-02-02 LAB — LEGIONELLA PNEUMOPHILA SEROGP 1 UR AG: L. pneumophila Serogp 1 Ur Ag: NEGATIVE

## 2019-02-02 NOTE — Progress Notes (Signed)
PROGRESS NOTE  Evan Price FOY:[id number] DOB: [birth year] DOA: 01/28/2019 PCP: Donalynn Furlong, MD  Brief History: 73 year old male with history of asthma, chronic back pain, anemia, and GERD presenting with 2-week history of fatigue and malaise. On 01/25/2019, the patient developed a cough and some shortness of breath with worsening fatigue. His cough was mostly nonproductive, but he would occasionally produce some yellow mucus. He denied hemoptysis. He denies any recent travels or sick contacts. He had some nausea without any emesis or diarrhea. He has had poor oral intake. The patient has never smoked. He denied any chest pain, abdominal pain, dysuria, hematuria, sore throat. He did have an occasional frontal headache. He denied any rashes or synovitis. Because of his symptoms, the patient went to the emergency department at [facility].At that time, the patient was noted to have WBC 18.0 with serum creatinine 1.20. The patient was told that he had pneumonia. He was given levofloxacin. There was an attempt to transfer to [facility] long hospital to rule out novel coronavirus infection., but the patient wanted to come to [facility].Nevertheless, the patient was discharged from the ED. His family drove him subsequently to [place].  In the emergency department, the patient had a low-grade temperature nine 9.2 with blood pressure of 84/65. The patient was fluid resuscitated. WBC was 17.0 with lactic acid 2.3. Chest x-ray showed peribronchial thickening without consolidation. He was started on levofloxacin and admitted for further evaluation. CTA chest showed LUL cavitary consolidation. Pulmonary medicine was consulted to assist with management.  Assessment/Plan: Sepsis -due to pneumonia -Lactic acid peaked 2.3 -Follow blood cultures although these are likely going to be negative as the patient received levofloxacin from the EDin [place] prior to coming to [facility] -Urinalysis  negative for pyuria -Check procalcitonin0.25>>>0.15 -Influenza PCR--neg -Viral respiratory panel--neg -Urine Legionella antigen--neg -Urine Streptococcus pneumoniae antigen--neg -continue IVF>>>saline lock -sepsis physiology resolved  Lobar Pneumonia/LUL necrotizing mass -Personally reviewed chest x-ray--peribronchial thickening without consolidation-neg -Influenza PCR--neg -Viral respiratory panel--neg -01/29/19 CTA chest--neg for PE; cavitary lesion apical LUL with adjacent consolidation -favor necrotizing PNA, but cannot r/o malignancy, MTB vs non-tuberculous mycobacteria -appreciate pulmonary consult -Quantiferon--indeterminant -sputum for AFB x 3--pending--->2 of 3 smears neg; third pending  Iron deficiency anemia -Holding on repletion of iron until etiology of infectious process clarified  B12 deficiency -Startedsupplementation  Elevated d-dimer -CT angiogram chestas above -personally reviewed EKG--sinus tachycardia, no STT changes  Asthma -Stable without wheezing -Albuterol nebulizer as needed shortness of breath  Chronic back pain -Continue home dose gabapentin and hydrocodone  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   Objective: Vitals:   02/02/19 0300 02/02/19 0400 02/02/19 0800 02/02/19 0900  BP:  112/62 106/66   Pulse: 83 78 80 82  Resp: 16 17 14  (!) 23  Temp:  97.6 F (36.4 C)  97.6 F (36.4 C)  TempSrc:    Oral  SpO2: 98%  95% 98% 98%  Weight:      Height:        Intake/Output Summary (Last 24 hours)  at 02/02/2019 1734 Last data filed at 02/02/2019 0948 Gross per 24 hour  Intake 10 ml  Output 750 ml  Net -740 ml   Weight change:  Exam:   General:  Pt is alert, follows commands appropriately, not in acute distress  HEENT: No icterus, No thrush, No neck mass, [place]/AT  Cardiovascular: RRR, S1/S2, no rubs, no gallops  Respiratory: L upper rales.  R-CTA. No wheeze  Abdomen: Soft/+BS, non tender, non distended, no guarding  Extremities: No edema, No lymphangitis, No petechiae, No rashes, no synovitis   Data Reviewed: I have personally reviewed following labs and imaging studies Basic Metabolic Panel: Recent Labs  Lab 01/28/19 1949 01/29/19 0426 01/30/19 0555 01/31/19 0501 02/01/19 0527  NA 136 137 138 138 138  K 3.9 3.9 3.6 3.4* 3.3*  CL 102 107 108 105 104  CO2 22 21* 23 22 24   GLUCOSE 104* 104* 102* 103* 139*  BUN 19 18 15 14 13   CREATININE 1.14 1.13 0.98 1.03 1.04  CALCIUM 8.6* 8.1* 8.4* 8.5* 8.5*  MG  --   --   --   --  1.9  PHOS  --   --   --   --  3.5   Liver Function Tests: Recent Labs  Lab 01/28/19 1949 01/29/19 0426  AST 17 13*  ALT 14 12  ALKPHOS 104 89  BILITOT 0.9 0.9  PROT 6.9 6.1*  ALBUMIN 2.4* 2.0*   No results for input(s): LIPASE, AMYLASE in the last 168 hours. No results for input(s): AMMONIA in the last 168 hours. Coagulation Profile: Recent Labs  Lab 01/28/19 1949  INR 1.4*   CBC: Recent Labs  Lab 01/28/19 1949 01/29/19 0426 01/30/19 0555 01/31/19 0501 02/01/19 0527  WBC 17.0* 14.7* 10.6* 10.3 9.7  NEUTROABS 14.6*  --   --  7.9*  --   HGB 9.5* 8.5* 8.6* 8.8* 8.9*  HCT 30.3* 27.9*   25.6* 28.5* 29.1* 29.5*  MCV 87.3 88.0 90.2 88.7 88.1  PLT 465* 390 371 382 392   Cardiac Enzymes: Recent Labs  Lab 01/28/19 1949 01/30/19 0555 01/30/19 1137 01/30/19 1644  TROPONINI <0.03 <0.03 <0.03 <0.03   BNP: Invalid input(s): POCBNP CBG: No  results for input(s): GLUCAP in the last 168 hours. HbA1C: No results for input(s): HGBA1C in the last 72 hours. Urine analysis:    Component Value Date/Time   COLORURINE AMBER (A) 01/28/2019 1845   APPEARANCEUR CLEAR 01/28/2019 1845   LABSPEC 1.018 01/28/2019 1845   PHURINE 5.0 01/28/2019 1845   GLUCOSEU NEGATIVE 01/28/2019 1845   HGBUR SMALL (A) 01/28/2019 1845   BILIRUBINUR NEGATIVE 01/28/2019 1845   KETONESUR 20 (A) 01/28/2019 1845   PROTEINUR NEGATIVE 01/28/2019 1845   NITRITE NEGATIVE 01/28/2019 1845   LEUKOCYTESUR NEGATIVE 01/28/2019 1845   Sepsis Labs: @LABRCNTIP (procalcitonin:4,lacticidven:4) ) Recent Results (from the past 240 hour(s))  SARS Coronavirus 2 (CEPHEID- Performed in [place] hospital lab), Hosp Order     Status: None   Collection Time: 01/28/19  7:35 PM  Result Value Ref Range Status   SARS Coronavirus 2 NEGATIVE NEGATIVE Final    Comment: (NOTE) If result is NEGATIVE SARS-CoV-2 target nucleic acids are NOT DETECTED. The SARS-CoV-2 RNA is generally detectable in upper and lower  respiratory specimens during the acute phase of infection. The lowest  concentration of SARS-CoV-2 viral copies this assay can detect is 250  copies / mL. A negative result does not preclude SARS-CoV-2 infection  and should not be used as the sole  basis for treatment or other  patient management decisions.  A negative result may occur with  improper specimen collection / handling, submission of specimen other  than nasopharyngeal swab, presence of viral mutation(s) within the  areas targeted by this assay, and inadequate number of viral copies  (<250 copies / mL). A negative result must be combined with clinical  observations, patient history, and epidemiological information. If result is POSITIVE SARS-CoV-2 target nucleic acids are DETECTED. The SARS-CoV-2 RNA is generally detectable in upper and lower  respiratory specimens dur ing the acute phase of infection.  Positive    results are indicative of active infection with SARS-CoV-2.  Clinical  correlation with patient history and other diagnostic information is  necessary to determine patient infection status.  Positive results do  not rule out bacterial infection or co-infection with other viruses. If result is PRESUMPTIVE POSTIVE SARS-CoV-2 nucleic acids MAY BE PRESENT.   A presumptive positive result was obtained on the submitted specimen  and confirmed on repeat testing.  While 2019 novel coronavirus  (SARS-CoV-2) nucleic acids may be present in the submitted sample  additional confirmatory testing may be necessary for epidemiological  and / or clinical management purposes  to differentiate between  SARS-CoV-2 and other Sarbecovirus currently known to infect humans.  If clinically indicated additional testing with an alternate test  methodology [phone]) is advised. The SARS-CoV-2 RNA is generally  detectable in upper and lower respiratory sp ecimens during the acute  phase of infection. The expected result is Negative. Fact Sheet for Patients:  StrictlyIdeas.no Fact Sheet for Healthcare Providers: BankingDealers.co.za This test is not yet approved or cleared by the [place] FDA and has been authorized for detection and/or diagnosis of SARS-CoV-2 by FDA under an Emergency Use Authorization (EUA).  This EUA will remain in effect (meaning this test can be used) for the duration of the COVID-19 declaration under Section 564(b)(1) of the Act, 21 U.S.C. section 360bbb-3(b)(1), unless the authorization is terminated or revoked sooner. Performed at [facility], [address], [place], [place] [address]   Culture, blood (Routine x 2)     Status: None   Collection Time: 01/28/19  7:50 PM  Result Value Ref Range Status   Specimen Description BLOOD BLOOD RIGHT WRIST  Final   Special Requests   Final    BOTTLES DRAWN AEROBIC AND ANAEROBIC Blood Culture  adequate volume   Culture   Final    NO GROWTH 5 DAYS Performed at [facility], [address], [place], [place] [address]    Report Status 02/02/2019 FINAL  Final  Culture, blood (Routine x 2)     Status: None   Collection Time: 01/28/19  8:00 PM  Result Value Ref Range Status   Specimen Description BLOOD SITE NOT SPECIFIED  Final   Special Requests   Final    BOTTLES DRAWN AEROBIC AND ANAEROBIC Blood Culture adequate volume   Culture   Final    NO GROWTH 5 DAYS Performed at [facility], [address], [place], [place] [address]    Report Status 02/02/2019 FINAL  Final  MRSA PCR Screening     Status: None   Collection Time: 01/29/19 12:01 AM  Result Value Ref Range Status   MRSA by PCR NEGATIVE NEGATIVE Final    Comment:        The GeneXpert MRSA Assay (FDA approved for NASAL specimens only), is one component of a comprehensive MRSA colonization surveillance program. It is not intended to diagnose MRSA  infection nor to guide or monitor treatment for MRSA infections. Performed at [facility], [address], [place], [place] [address]   Respiratory Panel by PCR     Status: None   Collection Time: 01/29/19  8:06 AM  Result Value Ref Range Status   Adenovirus NOT DETECTED NOT DETECTED Final   Coronavirus 229E NOT DETECTED NOT DETECTED Final    Comment: (NOTE) The Coronavirus on the Respiratory Panel, DOES NOT test for the novel  Coronavirus (2019 nCoV)    Coronavirus HKU1 NOT DETECTED NOT DETECTED Final   Coronavirus NL63 NOT DETECTED NOT DETECTED Final   Coronavirus OC43 NOT DETECTED NOT DETECTED Final   Metapneumovirus NOT DETECTED NOT DETECTED Final   Rhinovirus / Enterovirus NOT DETECTED NOT DETECTED Final   Influenza A NOT DETECTED NOT DETECTED Final   Influenza B NOT DETECTED NOT DETECTED Final   Parainfluenza Virus 1 NOT DETECTED NOT DETECTED Final   Parainfluenza Virus 2 NOT DETECTED NOT DETECTED Final   Parainfluenza Virus 3 NOT DETECTED NOT DETECTED Final     Parainfluenza Virus 4 NOT DETECTED NOT DETECTED Final   Respiratory Syncytial Virus NOT DETECTED NOT DETECTED Final   Bordetella pertussis NOT DETECTED NOT DETECTED Final   Chlamydophila pneumoniae NOT DETECTED NOT DETECTED Final   Mycoplasma pneumoniae NOT DETECTED NOT DETECTED Final    Comment: Performed at [facility], [place] [address], [place], [place] [address]  Acid Fast Smear (AFB)     Status: None   Collection Time: 01/30/19  6:30 AM  Result Value Ref Range Status   AFB Specimen Processing Concentration  Final   Acid Fast Smear Negative  Final    Comment: (NOTE) Performed At: [facility] [address] [place], [place] [id number] Rush Farmer MD WG:[id number]    Source (AFB) TRACHEAL ASPIRATE  Final    Comment: Performed at [facility], [address] [place], [place] [address]  Culture, respiratory     Status: None   Collection Time: 01/30/19  6:32 AM  Result Value Ref Range Status   Specimen Description   Final    TRACHEAL ASPIRATE Performed at [facility], [address], [place], [place] [address]    Special Requests   Final    NONE Performed at [facility], [address], [place], [place] [address]    Gram Stain   Final    NO WBC SEEN RARE GRAM POSITIVE COCCI IN PAIRS RARE GRAM VARIABLE ROD RARE SQUAMOUS EPITHELIAL CELLS PRESENT    Culture   Final    FEW Consistent with normal respiratory flora. Performed at [facility], [place] [address], [place], [place] [address]    Report Status 02/01/2019 FINAL  Final  Acid Fast Smear (AFB)     Status: None   Collection Time: 01/31/19  6:30 AM  Result Value Ref Range Status   AFB Specimen Processing Concentration  Final   Acid Fast Smear Negative  Final    Comment: (NOTE) Performed At: [facility] [place], [place] [id number] Rush Farmer MD IH:[id number]    Source (AFB) SPU  Final    Comment: Performed at [facility], [address] [place], [place] [address]      Scheduled Meds:  Chlorhexidine Gluconate Cloth  6 each Topical Daily   feeding supplement (PRO-STAT SUGAR FREE 64)  30 mL Oral BID   gabapentin  400 mg Oral TID   sodium chloride flush  10-40 mL Intracatheter Q12H   vitamin  B-12  500 mcg Oral Daily   Continuous Infusions:  clindamycin (CLEOCIN) IV 600 mg (02/02/19 1728)   levofloxacin (LEVAQUIN) IV 750 mg (02/01/19 2200)    Procedures/Studies: Ct Angio Chest Pe W Or Wo Contrast  Result Date: 01/29/2019 CLINICAL DATA:  Elevated D-dimer, shortness of breath EXAM: CT ANGIOGRAPHY CHEST WITH CONTRAST TECHNIQUE: Multidetector CT imaging of the chest was performed using the standard protocol during bolus administration of intravenous contrast. Multiplanar CT image reconstructions and MIPs were obtained to evaluate the vascular anatomy. CONTRAST:  139mL OMNIPAQUE IOHEXOL 350 MG/ML SOLN COMPARISON:  Chest radiographs, 01/28/2019 FINDINGS: Cardiovascular: Satisfactory opacification of the pulmonary arteries to the segmental level. No evidence of pulmonary embolism. Normal heart size. No pericardial effusion. Mediastinum/Nodes: There is soft tissue thickening about the left hilum and enlarged mediastinal and AP window lymph nodes measuring up to 1.2 cm in short axis. Thyroid gland, trachea, and esophagus demonstrate no significant findings. Hiatal hernia. Lungs/Pleura: There is a cavitary lesion of the apical left upper lobe with adjacent consolidation and volume loss measuring approximately 7.8 x 4.7 by 6.0 cm (series 8, image 33, series 9, image 104). Trace left pleural effusion. There is scattered centrilobular nodularity and ground-glass elsewhere bilaterally, for example in the posterior right upper lobe (series 8, image 57). Upper Abdomen: No acute abnormality. Musculoskeletal: No chest wall abnormality. No acute or significant osseous findings. Review of the MIP images confirms the above findings. IMPRESSION: 1.  Negative examination for  pulmonary embolism. 2. There is a cavitary lesion of the apical left upper lobe with adjacent consolidation and volume loss measuring approximately 7.8 x 4.7 by 6.0 cm (series 8, image 33, series 9, image 104). Trace left pleural effusion. There is scattered centrilobular nodularity and ground-glass elsewhere bilaterally, for example in the posterior right upper lobe (series 8, image 57). There is soft tissue thickening about the left hilum and enlarged mediastinal and AP window lymph nodes measuring up to 1.2 cm in short axis. Findings are concerning for malignancy, with differential consideration of cavitary infection, including tuberculosis. Electronically Signed   By: Eddie Candle M.D.   On: 01/29/2019 10:50   Dg Chest Port 1 View  Result Date: 01/28/2019 CLINICAL DATA:  Cough, congestion EXAM: PORTABLE CHEST 1 VIEW COMPARISON:  None. FINDINGS: Heart is borderline in size. Vascular congestion. Peribronchial thickening. No confluent opacities, effusions or edema. No acute bony abnormality. IMPRESSION: Borderline cardiomegaly with vascular congestion. Peribronchial thickening may reflect bronchitis. Electronically Signed   By: Rolm Baptise M.D.   On: 01/28/2019 19:54    Orson Eva, DO  [facility] Pager [phone]  If 7PM-7AM, please contact night-coverage [email or web address] Password [facility] 02/02/2019, 5:34 PM   LOS: 5 days

## 2019-02-02 NOTE — TOC Progression Note (Signed)
Reviewed pt's record. Pt medium risk on re-admission scale. Pt is not in the [facility] network of physicians. No immediate TOC needs identified. Will follow and watch for needs.

## 2019-02-02 NOTE — Care Management Important Message (Signed)
Important Message  Patient Details  Name: Evan Price MRN: [id number] Date of Birth: 1945-10-13   Medicare Important Message Given:  Yes    [name] 02/02/2019, 3:24 PM

## 2019-02-02 NOTE — Progress Notes (Signed)
Subjective: He was admitted with pneumonia that was on CT angiogram was shown to be cavitary.  Concern of course for cavitary pneumonia TB or tumor.  He is currently on respiratory isolation and ruling out TB.  He says he feels better.  He has no new complaints.  His breathing is okay.  He was septic on admission and has responded to fluid resuscitation  Objective: Vital signs in last 24 hours: Temp:  [97.5 F (36.4 C)-98.5 F (36.9 C)] 97.6 F (36.4 C) (05/11 0400) Pulse Rate:  [74-99] 78 (05/11 0400) Resp:  [13-27] 17 (05/11 0400) BP: (104-118)/(62-74) 112/62 (05/11 0400) SpO2:  [95 %-100 %] 95 % (05/11 0400) Weight change:  Last BM Date: 01/29/19  Intake/Output from previous day: 05/10 0701 - 05/11 0700 In: 580 [P.O.:480; IV Piggyback:100] Out: 700 [Urine:700]  PHYSICAL EXAM General appearance: alert, cooperative and no distress Resp: clear to auscultation bilaterally Cardio: regular rate and rhythm, S1, S2 normal, no murmur, click, rub or gallop GI: soft, non-tender; bowel sounds normal; no masses,  no organomegaly Extremities: extremities normal, atraumatic, no cyanosis or edema  Lab Results:  Results for orders placed or performed during the hospital encounter of 01/28/19 (from the past 48 hour(s))  Basic metabolic panel     Status: Abnormal   Collection Time: 02/01/19  5:27 AM  Result Value Ref Range   Sodium 138 135 - 145 mmol/L   Potassium 3.3 (L) 3.5 - 5.1 mmol/L   Chloride 104 98 - 111 mmol/L   CO2 24 22 - 32 mmol/L   Glucose, Bld 139 (H) 70 - 99 mg/dL   BUN 13 8 - 23 mg/dL   Creatinine, Ser 1.04 0.61 - 1.24 mg/dL   Calcium 8.5 (L) 8.9 - 10.3 mg/dL   GFR calc non Af Amer >60 >60 mL/min   GFR calc Af Amer >60 >60 mL/min   Anion gap 10 5 - 15    Comment: Performed at [facility], [address] [place], [place] [address]  Magnesium     Status: None   Collection Time: 02/01/19  5:27 AM  Result Value Ref Range   Magnesium 1.9 1.7 - 2.4 mg/dL    Comment:  Performed at [facility], [address], [place], [place] [address]  Phosphorus     Status: None   Collection Time: 02/01/19  5:27 AM  Result Value Ref Range   Phosphorus 3.5 2.5 - 4.6 mg/dL    Comment: Performed at [facility], [address], [place], [place] [address]  CBC     Status: Abnormal   Collection Time: 02/01/19  5:27 AM  Result Value Ref Range   WBC 9.7 4.0 - 10.5 K/uL   RBC 3.35 (L) 4.22 - 5.81 MIL/uL   Hemoglobin 8.9 (L) 13.0 - 17.0 g/dL   HCT 29.5 (L) 39.0 - 52.0 %   MCV 88.1 80.0 - 100.0 fL   MCH 26.6 26.0 - 34.0 pg   MCHC 30.2 30.0 - 36.0 g/dL   RDW 14.7 11.5 - 15.5 %   Platelets 392 150 - 400 K/uL   nRBC 0.0 0.0 - 0.2 %    Comment: Performed at [facility], [address], [place], [place] [address]    ABGS No results for input(s): PHART, PO2ART, TCO2, HCO3 in the last 72 hours.  Invalid input(s): PCO2 CULTURES Recent Results (from the past 240 hour(s))  SARS Coronavirus 2 (CEPHEID- Performed in [place] hospital lab), Hosp Order     Status: None  Collection Time: 01/28/19  7:35 PM  Result Value Ref Range Status   SARS Coronavirus 2 NEGATIVE NEGATIVE Final    Comment: (NOTE) If result is NEGATIVE SARS-CoV-2 target nucleic acids are NOT DETECTED. The SARS-CoV-2 RNA is generally detectable in upper and lower  respiratory specimens during the acute phase of infection. The lowest  concentration of SARS-CoV-2 viral copies this assay can detect is 250  copies / mL. A negative result does not preclude SARS-CoV-2 infection  and should not be used as the sole basis for treatment or other  patient management decisions.  A negative result may occur with  improper specimen collection / handling, submission of specimen other  than nasopharyngeal swab, presence of viral mutation(s) within the  areas targeted by this assay, and inadequate number of viral copies  (<250 copies / mL). A negative result must be combined with clinical  observations, patient history,  and epidemiological information. If result is POSITIVE SARS-CoV-2 target nucleic acids are DETECTED. The SARS-CoV-2 RNA is generally detectable in upper and lower  respiratory specimens dur ing the acute phase of infection.  Positive  results are indicative of active infection with SARS-CoV-2.  Clinical  correlation with patient history and other diagnostic information is  necessary to determine patient infection status.  Positive results do  not rule out bacterial infection or co-infection with other viruses. If result is PRESUMPTIVE POSTIVE SARS-CoV-2 nucleic acids MAY BE PRESENT.   A presumptive positive result was obtained on the submitted specimen  and confirmed on repeat testing.  While 2019 novel coronavirus  (SARS-CoV-2) nucleic acids may be present in the submitted sample  additional confirmatory testing may be necessary for epidemiological  and / or clinical management purposes  to differentiate between  SARS-CoV-2 and other Sarbecovirus currently known to infect humans.  If clinically indicated additional testing with an alternate test  methodology [phone]) is advised. The SARS-CoV-2 RNA is generally  detectable in upper and lower respiratory sp ecimens during the acute  phase of infection. The expected result is Negative. Fact Sheet for Patients:  StrictlyIdeas.no Fact Sheet for Healthcare Providers: BankingDealers.co.za This test is not yet approved or cleared by the [place] FDA and has been authorized for detection and/or diagnosis of SARS-CoV-2 by FDA under an Emergency Use Authorization (EUA).  This EUA will remain in effect (meaning this test can be used) for the duration of the COVID-19 declaration under Section 564(b)(1) of the Act, 21 U.S.C. section 360bbb-3(b)(1), unless the authorization is terminated or revoked sooner. Performed at [facility], [address] [place], [place] [address]   Culture, blood  (Routine x 2)     Status: None   Collection Time: 01/28/19  7:50 PM  Result Value Ref Range Status   Specimen Description BLOOD BLOOD RIGHT WRIST  Final   Special Requests   Final    BOTTLES DRAWN AEROBIC AND ANAEROBIC Blood Culture adequate volume   Culture   Final    NO GROWTH 5 DAYS Performed at [facility], [address], [place], [place] [address]    Report Status 02/02/2019 FINAL  Final  Culture, blood (Routine x 2)     Status: None   Collection Time: 01/28/19  8:00 PM  Result Value Ref Range Status   Specimen Description BLOOD SITE NOT SPECIFIED  Final   Special Requests   Final    BOTTLES DRAWN AEROBIC AND ANAEROBIC Blood Culture adequate volume   Culture   Final    NO GROWTH 5 DAYS  Performed at [facility], [address] [place], [place] [address]    Report Status 02/02/2019 FINAL  Final  MRSA PCR Screening     Status: None   Collection Time: 01/29/19 12:01 AM  Result Value Ref Range Status   MRSA by PCR NEGATIVE NEGATIVE Final    Comment:        The GeneXpert MRSA Assay (FDA approved for NASAL specimens only), is one component of a comprehensive MRSA colonization surveillance program. It is not intended to diagnose MRSA infection nor to guide or monitor treatment for MRSA infections. Performed at [facility], [address], [place], [place] [address]   Respiratory Panel by PCR     Status: None   Collection Time: 01/29/19  8:06 AM  Result Value Ref Range Status   Adenovirus NOT DETECTED NOT DETECTED Final   Coronavirus 229E NOT DETECTED NOT DETECTED Final    Comment: (NOTE) The Coronavirus on the Respiratory Panel, DOES NOT test for the novel  Coronavirus (2019 nCoV)    Coronavirus HKU1 NOT DETECTED NOT DETECTED Final   Coronavirus NL63 NOT DETECTED NOT DETECTED Final   Coronavirus OC43 NOT DETECTED NOT DETECTED Final   Metapneumovirus NOT DETECTED NOT DETECTED Final   Rhinovirus / Enterovirus NOT DETECTED NOT DETECTED Final   Influenza A NOT DETECTED  NOT DETECTED Final   Influenza B NOT DETECTED NOT DETECTED Final   Parainfluenza Virus 1 NOT DETECTED NOT DETECTED Final   Parainfluenza Virus 2 NOT DETECTED NOT DETECTED Final   Parainfluenza Virus 3 NOT DETECTED NOT DETECTED Final   Parainfluenza Virus 4 NOT DETECTED NOT DETECTED Final   Respiratory Syncytial Virus NOT DETECTED NOT DETECTED Final   Bordetella pertussis NOT DETECTED NOT DETECTED Final   Chlamydophila pneumoniae NOT DETECTED NOT DETECTED Final   Mycoplasma pneumoniae NOT DETECTED NOT DETECTED Final    Comment: Performed at [facility], [place] [address] [place], [place] [address]  Acid Fast Smear (AFB)     Status: None   Collection Time: 01/30/19  6:30 AM  Result Value Ref Range Status   AFB Specimen Processing Concentration  Final   Acid Fast Smear Negative  Final    Comment: (NOTE) Performed At: [facility] [address] [place], [place] [id number] Rush Farmer MD GQ:[id number]    Source (AFB) TRACHEAL ASPIRATE  Final    Comment: Performed at [facility], [address] [place], [place] [address]  Culture, respiratory     Status: None   Collection Time: 01/30/19  6:32 AM  Result Value Ref Range Status   Specimen Description   Final    TRACHEAL ASPIRATE Performed at [facility], [address] [place], [place] [address]    Special Requests   Final    NONE Performed at [facility], [address], [place], [place] [address]    Gram Stain   Final    NO WBC SEEN RARE GRAM POSITIVE COCCI IN PAIRS RARE GRAM VARIABLE ROD RARE SQUAMOUS EPITHELIAL CELLS PRESENT    Culture   Final    FEW Consistent with normal respiratory flora. Performed at [facility], [place] [address], [place], [place] [address]    Report Status 02/01/2019 FINAL  Final  Acid Fast Smear (AFB)     Status: None   Collection Time: 01/31/19  6:30 AM  Result Value Ref Range Status   AFB Specimen Processing Concentration  Final   Acid Fast Smear Negative  Final     Comment: (  NOTE) Performed At: [facility] [place], [place] [id number] Rush Farmer MD UD:[id number]    Source (AFB) SPU  Final    Comment: Performed at [facility], [address] [place], [place] [address]   Studies/Results: No results found.  Medications:  Prior to Admission:  Medications Prior to Admission  Medication Sig Dispense Refill Last Dose  . albuterol (PROVENTIL) (2.5 MG/3ML) 0.083% nebulizer solution Take 2.5 mg by nebulization every 6 (six) hours as needed for wheezing or shortness of breath.   6 01/28/2019 at Unknown time  . gabapentin (NEURONTIN) 400 MG capsule Take 1 capsule by mouth 3 (three) times daily.  1 01/28/2019 at Unknown time  . HYDROcodone-acetaminophen (NORCO/VICODIN) 5-325 MG tablet Take 1 tablet by mouth every 6 (six) hours as needed.  0 01/28/2019 at Unknown time  . VENTOLIN HFA 108 (90 Base) MCG/ACT inhaler   3 01/28/2019 at Unknown time  . chlorproMAZINE (THORAZINE) 25 MG tablet Take 1 tablet (25 mg total) by mouth 3 (three) times daily as needed for up to 3 days for hiccoughs. 9 tablet 0    Scheduled: . Chlorhexidine Gluconate Cloth  6 each Topical Daily  . feeding supplement (PRO-STAT SUGAR FREE 64)  30 mL Oral BID  . gabapentin  400 mg Oral TID  . sodium chloride flush  10-40 mL Intracatheter Q12H  . vitamin B-12  500 mcg Oral Daily   Continuous: . clindamycin (CLEOCIN) IV 600 mg (02/02/19 0100)  . levofloxacin (LEVAQUIN) IV 750 mg (02/01/19 2200)   YDX:AJOINOMVEHMCN, albuterol, guaiFENesin, HYDROcodone-acetaminophen, sodium chloride flush  Assesment: He was admitted with pneumonia and sepsis.  Sepsis pathophysiology has resolved.  His pneumonia is cavitary so TB has been ruled out.  He has 2- AFB smears thus far and the third is in process.  He does not have typical symptoms of tuberculosis so I think this is more likely a cavitary pneumonia.  He says he feels better with treatment. Principal Problem:   Hypotension Active  Problems:   CAP (community acquired pneumonia)   Sepsis ([place])   Chronic back pain   Anemia   Thrombocytosis ([facility])   Severe protein-calorie malnutrition ([place])   Sepsis due to undetermined organism ([place])   Iron deficiency anemia due to chronic blood loss   Iron deficiency anemia   CKD (chronic kidney disease) stage 2, GFR 60-89 ml/min   Lobar pneumonia ([place])    Plan: Continue treatments.  Await third AFB smear.  No change in medications    LOS: 5 days   Alonza Bogus 02/02/2019, 7:57 AM

## 2019-02-03 DIAGNOSIS — D500 Iron deficiency anemia secondary to blood loss (chronic): Secondary | ICD-10-CM

## 2019-02-03 LAB — BASIC METABOLIC PANEL
Anion gap: 8 (ref 5–15)
BUN: 15 mg/dL (ref 8–23)
CO2: 26 mmol/L (ref 22–32)
Calcium: 8.8 mg/dL — ABNORMAL LOW (ref 8.9–10.3)
Chloride: 104 mmol/L (ref 98–111)
Creatinine, Ser: 1.17 mg/dL (ref 0.61–1.24)
GFR calc Af Amer: >60 mL/min (ref >60)
GFR calc non Af Amer: >60 mL/min (ref >60)
Glucose, Bld: 99 mg/dL (ref 70–99)
Potassium: 4 mmol/L (ref 3.5–5.1)
Sodium: 138 mmol/L (ref 135–145)

## 2019-02-03 LAB — ACID FAST SMEAR (AFB, MYCOBACTERIA): Acid Fast Smear: NEGATIVE

## 2019-02-03 LAB — CBC
HCT: 31.9 % — ABNORMAL LOW (ref 39.0–52.0)
Hemoglobin: 9.7 g/dL — ABNORMAL LOW (ref 13.0–17.0)
MCH: 26.7 pg (ref 26.0–34.0)
MCHC: 30.4 g/dL (ref 30.0–36.0)
MCV: 87.9 fL (ref 80.0–100.0)
Platelets: 478 10*3/uL — ABNORMAL HIGH (ref 150–400)
RBC: 3.63 MIL/uL — ABNORMAL LOW (ref 4.22–5.81)
RDW: 15.2 % (ref 11.5–15.5)
WBC: 11.9 10*3/uL — ABNORMAL HIGH (ref 4.0–10.5)
nRBC: 0 % (ref 0.0–0.2)

## 2019-02-03 MED ORDER — LEVOFLOXACIN 750 MG PO TABS: 750.0000 mg | ORAL_TABLET | Freq: Every day | ORAL | 0 refills | Status: DC

## 2019-02-03 MED ORDER — LEVOFLOXACIN 750 MG PO TABS: 750.0000 mg | ORAL_TABLET | Freq: Every day | ORAL | Status: DC

## 2019-02-03 MED ORDER — FERROUS SULFATE 325 (65 FE) MG PO TABS: 325.0000 mg | ORAL_TABLET | Freq: Two times a day (BID) | ORAL | 3 refills | Status: DC

## 2019-02-03 NOTE — Progress Notes (Signed)
He has 3- AFB smears.  He is hopeful of going home soon.  He has no other new complaints.   Exam: He is awake and alert and in no acute distress.  His lungs are fairly clear.  Assessment he has cavitary pneumonia.  There is some possibility that this could represent some sort of tumor but he is fairly low risk with no significant smoking history  Plan: He will need follow-up CT in approximately a month and he wants me to arrange that for him.  I will plan to follow more peripherally now.  Thanks for allowing me to see him with you

## 2019-02-03 NOTE — Plan of Care (Signed)
  Problem: Education: Goal: Knowledge of General Education information will improve Description Including pain rating scale, medication(s)/side effects and non-pharmacologic comfort measures Outcome: Adequate for Discharge   Problem: Pain Managment: Goal: General experience of comfort will improve Outcome: Progressing

## 2019-03-03 LAB — AFB ORGANISM ID BY DNA PROBE
M avium complex: NEGATIVE
M gordonae: POSITIVE — AB
M kansasii: NEGATIVE
M tuberculosis complex: NEGATIVE

## 2019-03-03 LAB — ACID FAST CULTURE WITH REFLEXED SENSITIVITIES (MYCOBACTERIA): Acid Fast Culture: POSITIVE

## 2019-03-11 ENCOUNTER — Other Ambulatory Visit (HOSPITAL_COMMUNITY): Payer: Self-pay | Admitting: Pulmonary Disease

## 2019-03-11 ENCOUNTER — Ambulatory Visit (HOSPITAL_COMMUNITY)
Admission: RE | Admit: 2019-03-11 | Discharge: 2019-03-11 | Disposition: A | Discharge status: Home / Self Care | Payer: Medicare Other | Source: Ambulatory Visit | Attending: Pulmonary Disease | Admitting: Pulmonary Disease

## 2019-03-11 ENCOUNTER — Other Ambulatory Visit: Payer: Self-pay

## 2019-03-11 DIAGNOSIS — J189 Pneumonia, unspecified organism: Secondary | ICD-10-CM | POA: Insufficient documentation

## 2019-03-16 LAB — ACID FAST CULTURE WITH REFLEXED SENSITIVITIES (MYCOBACTERIA): Acid Fast Culture: NEGATIVE

## 2019-03-17 LAB — ACID FAST CULTURE WITH REFLEXED SENSITIVITIES (MYCOBACTERIA): Acid Fast Culture: NEGATIVE

## 2019-03-24 ENCOUNTER — Telehealth: Payer: Self-pay | Admitting: Infectious Diseases

## 2019-03-24 NOTE — Telephone Encounter (Signed)
COVID-19 Pre-Screening Questions: ° °Do you currently have a fever (>100 °F), chills or unexplained body aches? No  ° °Are you currently experiencing new cough, shortness of breath, sore throat, runny nose?no   °•  °Have you recently travelled outside the state of [place] in the last 14 days? No  °•  °1. Have you been in contact with someone that is currently pending confirmation of Covid19 testing or has been confirmed to have the Covid19 virus?  No  ° °

## 2019-03-25 ENCOUNTER — Encounter: Payer: Self-pay | Admitting: Infectious Diseases

## 2019-03-25 ENCOUNTER — Ambulatory Visit (INDEPENDENT_AMBULATORY_CARE_PROVIDER_SITE_OTHER): Payer: Medicare Other | Admitting: Infectious Diseases

## 2019-03-25 ENCOUNTER — Other Ambulatory Visit: Payer: Self-pay

## 2019-03-25 VITALS — BP 134/70 | HR 90 | Ht 71.0 in | Wt 156.0 lb

## 2019-03-25 DIAGNOSIS — A310 Pulmonary mycobacterial infection: Secondary | ICD-10-CM

## 2019-03-25 DIAGNOSIS — J984 Other disorders of lung: Secondary | ICD-10-CM

## 2019-03-25 DIAGNOSIS — J189 Pneumonia, unspecified organism: Secondary | ICD-10-CM | POA: Diagnosis not present

## 2019-03-25 NOTE — Patient Instructions (Signed)
Minimize systemic steroid use as best able. If concern returns for atypical Mycobacterial pneumonia, recommend your pulmonologist perform a bronchoscopy for AFB, fungal, and routine cxs.

## 2019-03-25 NOTE — Progress Notes (Signed)
Subjective:    Patient ID: Evan Price, male    DOB: July 05, 1946, 73 y.o.   MRN: [id number]  HPI patient is a very pleasant 73 year old white male with asthma and gastroesophageal reflux who presents today for an evaluation for a recent cavitary pneumonia.  In early May 2020, he was admitted to [facility] with fever, malaise, and a productive cough.  He was initially treated with broad-spectrum antibiotics which were ultimately narrowed to Levaquin monotherapy.  During his initial work-up, he had a CT angiogram of the chest that showed a sizable left upper lobe cavitary lesion measuring 7.6 x 4.7 x 6.0 cm concerning for necrotizing pneumonia.  Routine respiratory panel PCR was pan negative, but due to the cavitary appearance of his pneumonia, he was placed in isolation and had 3 AFB sputum collected/expectorated.  1 of the 3 expectorated sputum was AFB positive on smear.  As this culture was pending at the time of his hospital discharge a DNA probe later confirmed Mycobacterium gordonae.  The patient is now approximately 5 weeks away from completion of his antibiotic course and states he is feeling much better with no further cough, no recurrent fever, and has maintained his weight since his hospital discharge.  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   Past Medical History:  Diagnosis Date  . Asthma   . Chronic back pain 2015  . Dyslipidemia   . GERD (gastroesophageal reflux disease)   . Pneumonia     Past Surgical History:  Procedure Laterality Date  . APPENDECTOMY    . CHOLECYSTECTOMY       Family History  Problem Relation Age of Onset  . Diabetes Mother   . Hypertension Father      Social History   Tobacco Use  . Smoking status: Never Smoker  . Smokeless tobacco: Never Used  Substance Use Topics  . Alcohol use:  Yes    Alcohol/week: 3.0 standard drinks    Types: 3 Cans of beer per week    Comment: 3 cans beer/ week  . Drug use: Never      has no history on file for sexual activity. 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 albuterol (PROVENTIL) (2.5 MG/3ML) 0.083% nebulizer solution Take 2.5 mg by nebulization every 6 (six) hours as needed for wheezing or shortness of breath.   6  . ferrous sulfate 325 (65 FE) MG tablet Take 1 tablet (325 mg total) by mouth 2 (two) times daily with a meal. 60 tablet 3  . gabapentin (NEURONTIN) 400 MG capsule Take 1 capsule by mouth 3 (three) times daily.  1  . HYDROcodone-acetaminophen (NORCO/VICODIN) 5-325 MG tablet Take 1 tablet by mouth every 6 (six) hours as needed.  0  . levofloxacin (LEVAQUIN) 750 MG tablet Take 1 tablet (750 mg total) by mouth daily. 8 tablet 0  . VENTOLIN HFA 108 (90 Base) MCG/ACT inhaler   3  . vitamin B-12 (CYANOCOBALAMIN) 500 MCG tablet Take 1 tablet (500 mcg total) by mouth daily.     No facility-administered medications prior to visit.      Allergies  Allergen Reactions  . Keflex [Cephalexin] Other (See Comments)    unknown      Review of Systems  Constitutional: Positive for  fatigue. Negative for chills and fever.  HENT: Negative for congestion, hearing loss, rhinorrhea and sinus pressure.   Eyes: Negative for photophobia, pain, redness and visual disturbance.  Respiratory: Negative for apnea, cough, shortness of breath and wheezing.   Cardiovascular: Negative for chest pain and palpitations.  Gastrointestinal: Negative for abdominal pain, constipation, diarrhea, nausea and vomiting.  Endocrine: Negative for cold intolerance, heat intolerance, polydipsia and polyuria.  Genitourinary: Negative for decreased urine volume, dysuria, frequency, hematuria and testicular pain.  Musculoskeletal: Negative for back pain, myalgias and neck  pain.  Skin: Negative for pallor and rash.  Allergic/Immunologic: Negative for immunocompromised state.  Neurological: Negative for dizziness, seizures, syncope, speech difficulty and light-headedness.  Hematological: Does not bruise/bleed easily.  Psychiatric/Behavioral: Negative for agitation and hallucinations. The patient is not nervous/anxious.       Objective:    Vitals:   03/25/19 0854  BP: 134/70  Pulse: 90  SpO2: 100%   Physical Exam Gen: pleasant, chronically ill appearing WM, NAD, A&Ox 3 Head: NCAT, no temporal wasting evident EENT: PERRL, EOMI, MMM, adequate dentition Neck: supple, no JVD CV: NRRR, no murmurs evident Pulm: CTA bilaterally, mild expiratory wheeze, no retractions Abd: soft, NTND, +BS Extrems: 1+ non-pitting LE edema, 2+ pulses Skin: no rashes, adequate skin turgor Neuro: CN II-XII grossly intact, no focal neurologic deficits appreciated, gait was slowed but normal, A&Ox 3    CTA chest on 01/29/2019  IMPRESSION: 1.  Negative examination for pulmonary embolism.  2. There is a cavitary lesion of the apical left upper lobe with adjacent consolidation and volume loss measuring approximately 7.8 x 4.7 by 6.0 cm (series 8, image 33, series 9, image 104). Trace left pleural effusion. There is scattered centrilobular nodularity and ground-glass elsewhere bilaterally, for example in the posterior right upper lobe (series 8, image 57). There is soft tissue thickening about the left hilum and enlarged mediastinal and AP window lymph nodes measuring up to 1.2 cm in short axis. Findings are concerning for malignancy, with differential consideration of cavitary infection, including tuberculosis.  Labs: Lab Results  Component Value Date   WBC 11.9 (H) 02/03/2019   HGB 9.7 (L) 02/03/2019   HCT 31.9 (L) 02/03/2019   MCV 87.9 02/03/2019   PLT 478 (H) 02/03/2019   Lab Results  Component Value Date   NA 138 02/03/2019   K 4.0 02/03/2019   CL 104  02/03/2019   CO2 26 02/03/2019   GLUCOSE 99 02/03/2019   BUN 15 02/03/2019   CREATININE 1.17 02/03/2019   CALCIUM 8.8 (L) 02/03/2019   MG 1.9 02/01/2019   PHOS 3.5 02/01/2019   No results found for: CRP     Assessment & Plan:  Patient is a pleasant 73 year old white male with asthma, gastroesophageal reflux disease, and history of Legionnaires pneumonia presenting for hospital follow-up from the recent episode of cavitary pneumonia.  Cavitary pneumonia - Although his cavitation was sizable and had his left apex, I agree with other providers that a necrotizing pneumonia is likely following an episode of aspiration appears more probable in the setting.  Clinically, he has significantly improved following a prolonged course of broad-spectrum antibiotics ultimately narrowed down to oral Levaquin as monotherapy.  What is unknown is whether the patient has any structural lung disease as an effect of his recent Legionnaires pneumonia several months prior.  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  Mycobacterium gordonae is  classically noted as a lab contaminant rather than a true human pathogenic strain, so no target antibiotic therapy would be needed for this isolate.  I would advise that the patient have a repeat CT scan performed in approximately 3 to 4 months to ensure that his cavitary lesion has at least improved.  He tells me that he has an appointment with a pulmonologist in the near future.  If his respiratory symptoms were to worsen, I would recommend the patient have a full bronchoscopy for the collection of routine, fungal, and AFB cultures to further evaluate.  It also would be reasonable to have cytology/pathology sent from a BAL as occasionally malignancies can present in the same manner radiographically.  The patient expressed understanding of these recommendations and plans to follow with  both his primary physician and his upcoming pulmonology appointment.

## 2021-03-19 IMAGING — CT CT ANGIOGRAPHY CHEST
2 of 6 series · 18 of 46 positions shown · IV contrast (omnipaque)
Comparison: Chest radiographs, 01/28/2019

CLINICAL DATA: Elevated D-dimer, shortness of breath

EXAM:
CT ANGIOGRAPHY CHEST WITH CONTRAST
TECHNIQUE: Multidetector CT imaging of the chest was performed using the
standard protocol during bolus administration of intravenous
contrast. Multiplanar CT image reconstructions and MIPs were
obtained to evaluate the vascular anatomy.
CONTRAST:  100mL OMNIPAQUE IOHEXOL 350 MG/ML SOLN

[Series 7: thins · axial · 0.67mm/px · z∈[+1094,+1415]mm · 15 of 353 slices shown]
[im 16/353  lung]
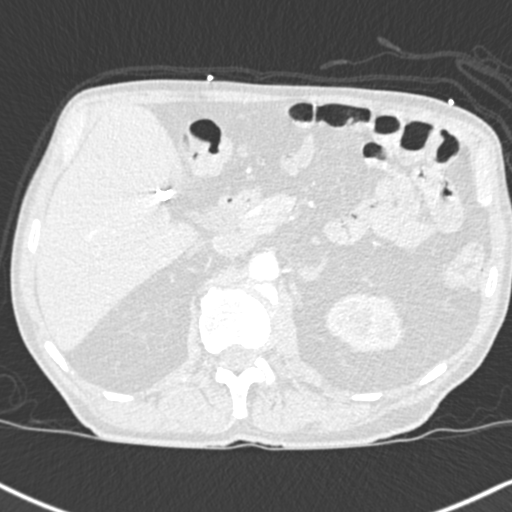
[im 46/353  soft-tissue]
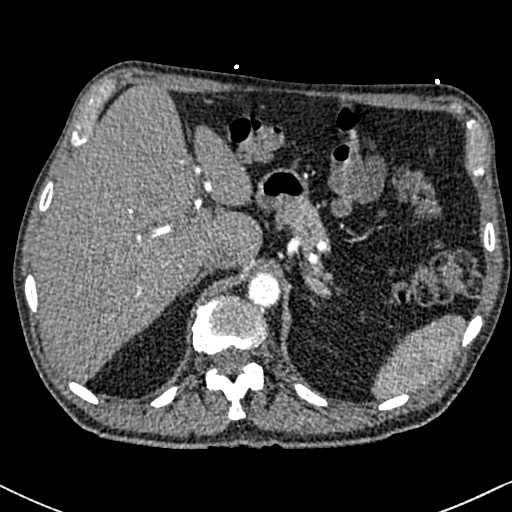
[im 62/353  lung]
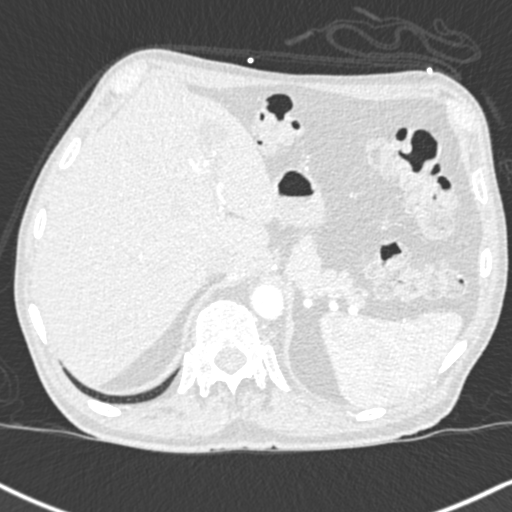
[im 92/353  soft-tissue]
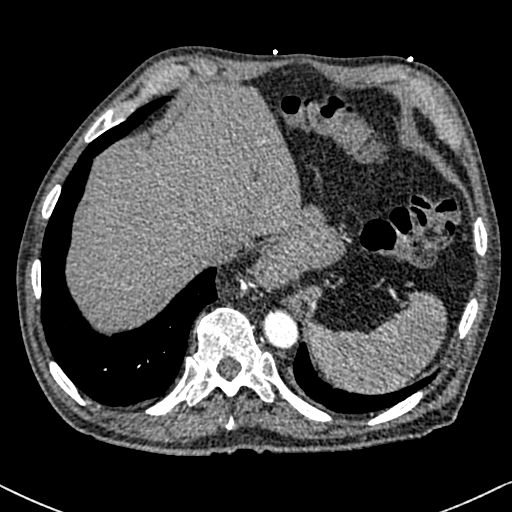
[im 108/353  lung]
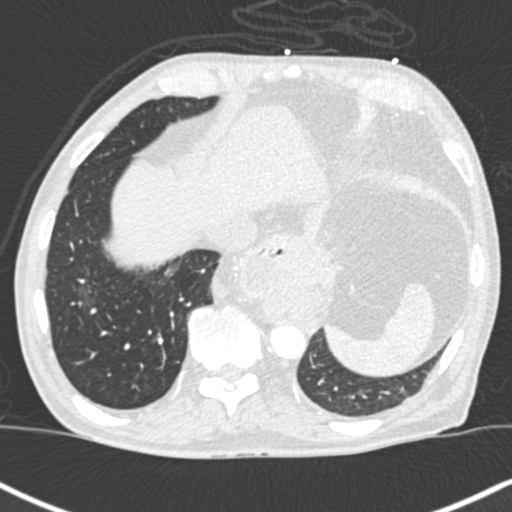
[im 138/353  soft-tissue]
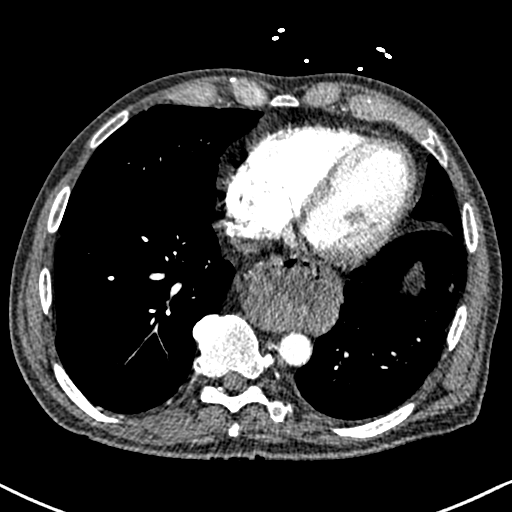
[im 154/353  lung]
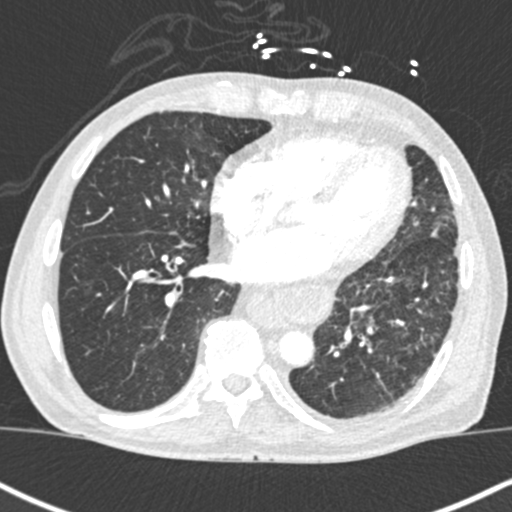
[im 184/353  soft-tissue]
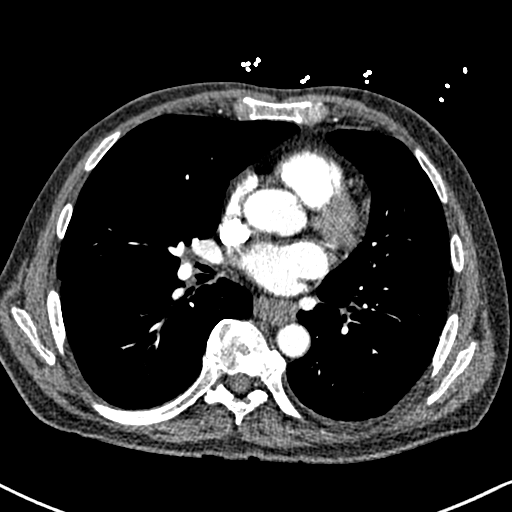
[im 199/353  lung]
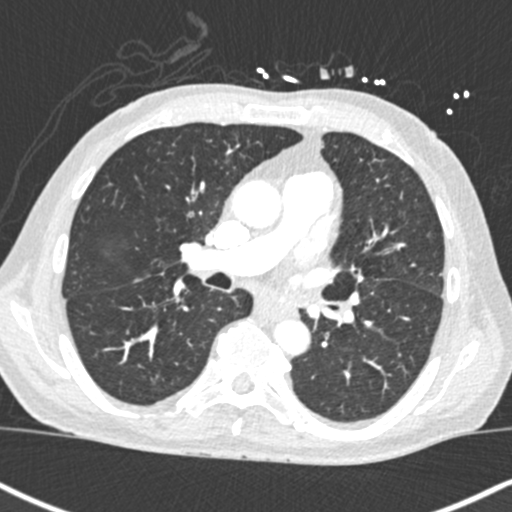
[im 215/353  soft-tissue]
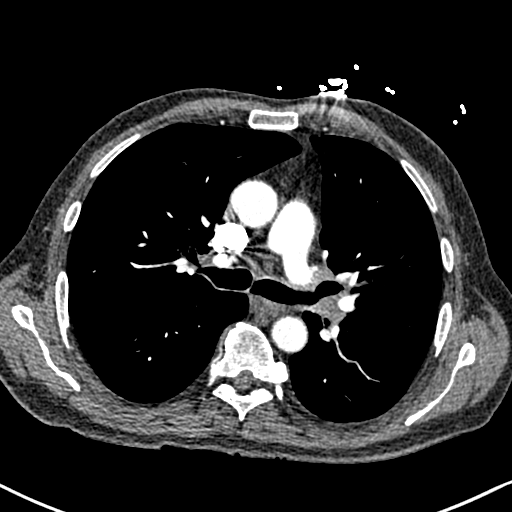
[im 245/353  lung]
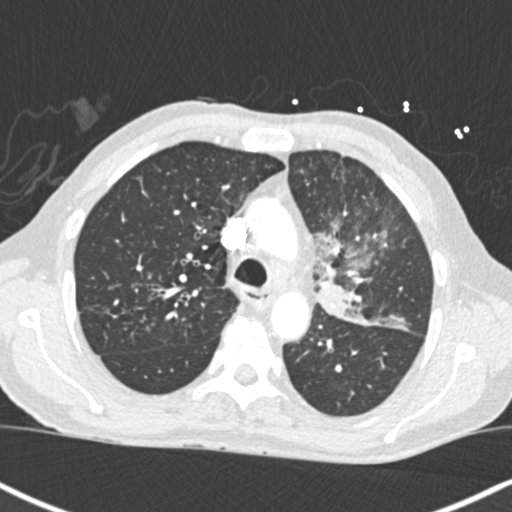
[im 261/353  soft-tissue]
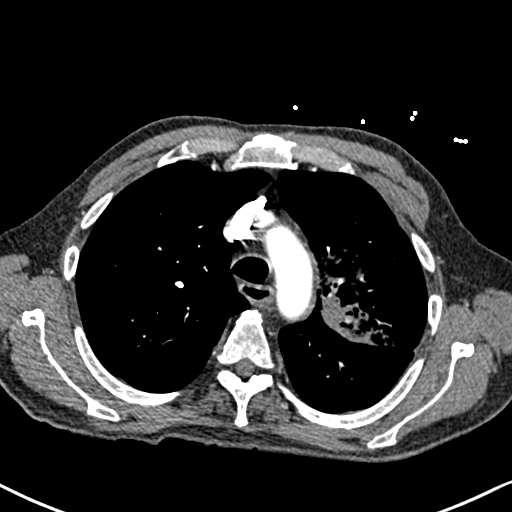
[im 291/353  lung]
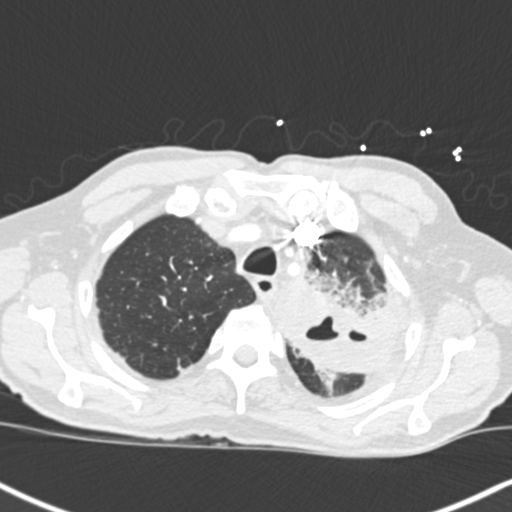
[im 307/353  soft-tissue]
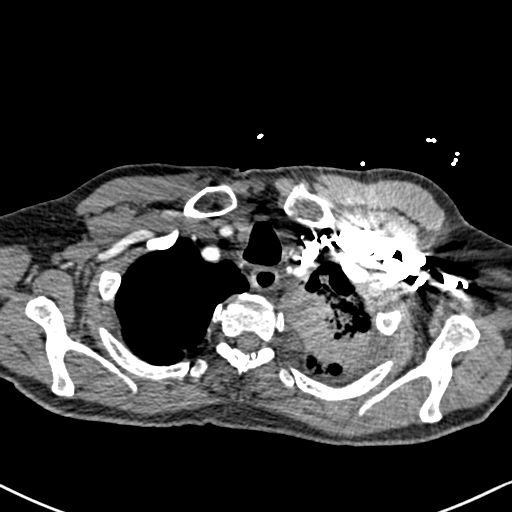
[im 337/353  lung]
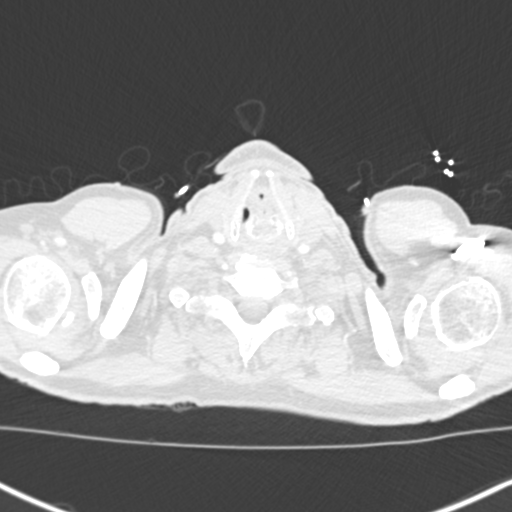

[Series 9: coronal mpr · coronal · 0.68mm/px · 3 of 151 slices shown]
[im 38/151  soft-tissue]
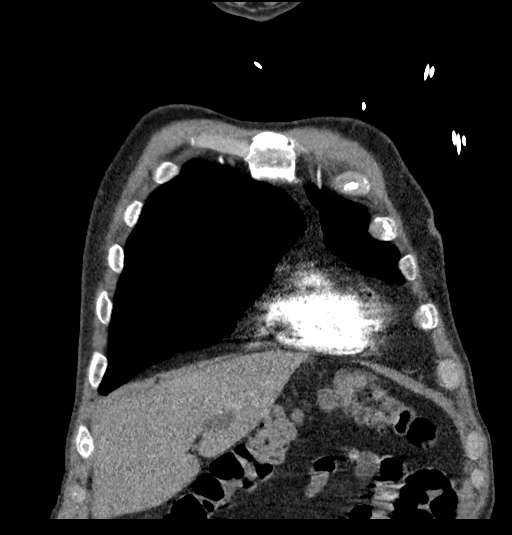
[im 76/151  soft-tissue]
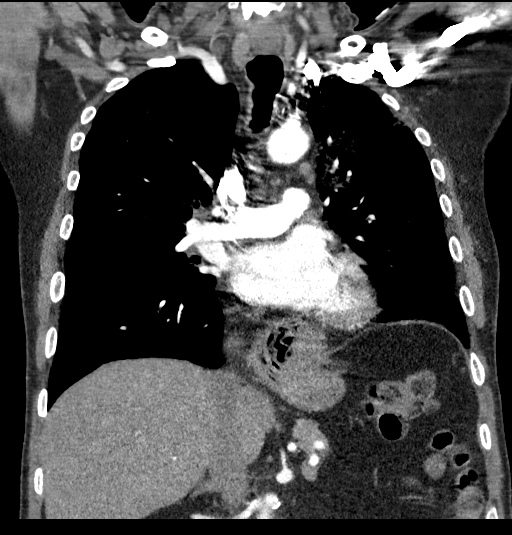
[im 113/151  soft-tissue]
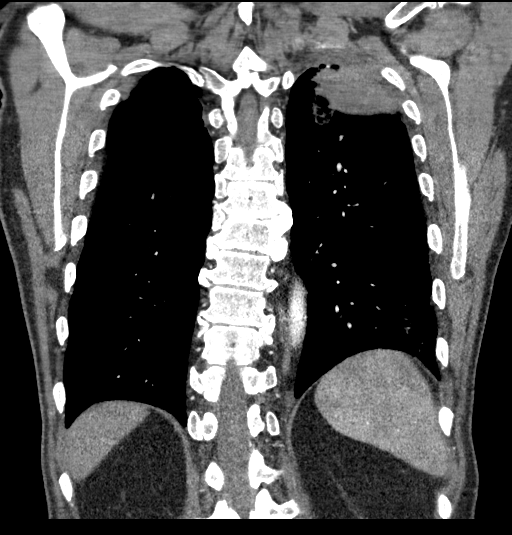

[18 of 46 positions shown; findings below may reference images not displayed]

FINDINGS: Cardiovascular: Satisfactory opacification of the pulmonary arteries
to the segmental level. No evidence of pulmonary embolism. Normal
heart size. No pericardial effusion.

Mediastinum/Nodes: There is soft tissue thickening about the left
hilum and enlarged mediastinal and AP window lymph nodes measuring
up to 1.2 cm in short axis. Thyroid gland, trachea, and esophagus
demonstrate no significant findings. Hiatal hernia.

Lungs/Pleura: There is a cavitary lesion of the apical left upper
lobe with adjacent consolidation and volume loss measuring
approximately 7.8 x 4.7 by 6.0 cm (series 8, image 33, series 9,
image 104). Trace left pleural effusion. There is scattered
centrilobular nodularity and ground-glass elsewhere bilaterally, for
example in the posterior right upper lobe (series 8, image 57).

Upper Abdomen: No acute abnormality.

Musculoskeletal: No chest wall abnormality. No acute or significant
osseous findings.

Review of the MIP images confirms the above findings.
IMPRESSION: 1.  Negative examination for pulmonary embolism.

2. There is a cavitary lesion of the apical left upper lobe with
adjacent consolidation and volume loss measuring approximately 7.8 x
4.7 by 6.0 cm (series 8, image 33, series 9, image 104). Trace left
pleural effusion. There is scattered centrilobular nodularity and
ground-glass elsewhere bilaterally, for example in the posterior
right upper lobe (series 8, image 57). There is soft tissue
thickening about the left hilum and enlarged mediastinal and AP
window lymph nodes measuring up to 1.2 cm in short axis. Findings
are concerning for malignancy, with differential consideration of
cavitary infection, including tuberculosis.

## 2024-04-04 ENCOUNTER — Emergency Department (HOSPITAL_COMMUNITY)

## 2024-04-04 ENCOUNTER — Other Ambulatory Visit: Payer: Self-pay

## 2024-04-04 ENCOUNTER — Emergency Department (HOSPITAL_COMMUNITY)
Admission: EM | Admit: 2024-04-04 | Discharge: 2024-04-04 | Disposition: A | Discharge status: Home / Self Care | Attending: Emergency Medicine | Admitting: Emergency Medicine

## 2024-04-04 ENCOUNTER — Encounter (HOSPITAL_COMMUNITY): Payer: Self-pay

## 2024-04-04 DIAGNOSIS — M8700 Idiopathic aseptic necrosis of unspecified bone: Secondary | ICD-10-CM | POA: Insufficient documentation

## 2024-04-04 DIAGNOSIS — W19XXXA Unspecified fall, initial encounter: Secondary | ICD-10-CM

## 2024-04-04 DIAGNOSIS — J45909 Unspecified asthma, uncomplicated: Secondary | ICD-10-CM | POA: Insufficient documentation

## 2024-04-04 DIAGNOSIS — R937 Abnormal findings on diagnostic imaging of other parts of musculoskeletal system: Secondary | ICD-10-CM | POA: Diagnosis not present

## 2024-04-04 DIAGNOSIS — S00432A Contusion of left ear, initial encounter: Secondary | ICD-10-CM | POA: Insufficient documentation

## 2024-04-04 DIAGNOSIS — M25552 Pain in left hip: Secondary | ICD-10-CM | POA: Diagnosis present

## 2024-04-04 DIAGNOSIS — R413 Other amnesia: Secondary | ICD-10-CM | POA: Insufficient documentation

## 2024-04-04 DIAGNOSIS — S0091XA Abrasion of unspecified part of head, initial encounter: Secondary | ICD-10-CM | POA: Diagnosis not present

## 2024-04-04 DIAGNOSIS — W0110XA Fall on same level from slipping, tripping and stumbling with subsequent striking against unspecified object, initial encounter: Secondary | ICD-10-CM | POA: Diagnosis not present

## 2024-04-04 DIAGNOSIS — N182 Chronic kidney disease, stage 2 (mild): Secondary | ICD-10-CM | POA: Insufficient documentation

## 2024-04-04 LAB — URINALYSIS, ROUTINE W REFLEX MICROSCOPIC
Bilirubin Urine: NEGATIVE
Glucose, UA: NEGATIVE mg/dL
Hgb urine dipstick: NEGATIVE
Ketones, ur: NEGATIVE mg/dL
Leukocytes,Ua: NEGATIVE
Nitrite: NEGATIVE
Protein, ur: NEGATIVE mg/dL
Specific Gravity, Urine: 1.004 — ABNORMAL LOW (ref 1.005–1.030)
pH: 7 (ref 5.0–8.0)

## 2024-04-04 LAB — CBC WITH DIFFERENTIAL/PLATELET
Abs Immature Granulocytes: 0.07 K/uL (ref 0.00–0.07)
Basophils Absolute: 0.1 K/uL (ref 0.0–0.1)
Basophils Relative: 0 %
Eosinophils Absolute: 0 K/uL (ref 0.0–0.5)
Eosinophils Relative: 0 %
HCT: 40.8 % (ref 39.0–52.0)
Hemoglobin: 12.3 g/dL — ABNORMAL LOW (ref 13.0–17.0)
Immature Granulocytes: 1 %
Lymphocytes Relative: 7 %
Lymphs Abs: 1 K/uL (ref 0.7–4.0)
MCH: 26.6 pg (ref 26.0–34.0)
MCHC: 30.1 g/dL (ref 30.0–36.0)
MCV: 88.3 fL (ref 80.0–100.0)
Monocytes Absolute: 1 K/uL (ref 0.1–1.0)
Monocytes Relative: 7 %
Neutro Abs: 11.9 K/uL — ABNORMAL HIGH (ref 1.7–7.7)
Neutrophils Relative %: 85 %
Platelets: 260 K/uL (ref 150–400)
RBC: 4.62 MIL/uL (ref 4.22–5.81)
RDW: 16 % — ABNORMAL HIGH (ref 11.5–15.5)
WBC: 14 K/uL — ABNORMAL HIGH (ref 4.0–10.5)
nRBC: 0 % (ref 0.0–0.2)

## 2024-04-04 LAB — BASIC METABOLIC PANEL WITH GFR
Anion gap: 12 (ref 5–15)
BUN: 16 mg/dL (ref 8–23)
CO2: 24 mmol/L (ref 22–32)
Calcium: 9.1 mg/dL (ref 8.9–10.3)
Chloride: 103 mmol/L (ref 98–111)
Creatinine, Ser: 1.37 mg/dL — ABNORMAL HIGH (ref 0.61–1.24)
GFR, Estimated: 53 mL/min — ABNORMAL LOW (ref >60)
Glucose, Bld: 104 mg/dL — ABNORMAL HIGH (ref 70–99)
Potassium: 4.1 mmol/L (ref 3.5–5.1)
Sodium: 139 mmol/L (ref 135–145)

## 2024-04-04 MED ORDER — MORPHINE SULFATE (PF) 4 MG/ML IV SOLN
4.0000 mg | Freq: Once | INTRAVENOUS | Status: AC
  Administered 2024-04-04: 4 mg via INTRAVENOUS
  Filled 2024-04-04: qty 1

## 2024-04-04 MED ORDER — HYDROMORPHONE HCL 1 MG/ML IJ SOLN
0.5000 mg | Freq: Once | INTRAMUSCULAR | Status: AC
  Administered 2024-04-04: 0.5 mg via INTRAVENOUS
  Filled 2024-04-04: qty 0.5

## 2024-04-04 MED ORDER — SODIUM CHLORIDE 0.9 % IV BOLUS
1000.0000 mL | Freq: Once | INTRAVENOUS | Status: AC
  Administered 2024-04-04: 1000 mL via INTRAVENOUS

## 2024-04-04 MED ORDER — ONDANSETRON HCL 4 MG/2ML IJ SOLN
4.0000 mg | Freq: Once | INTRAMUSCULAR | Status: AC
  Administered 2024-04-04: 4 mg via INTRAVENOUS
  Filled 2024-04-04: qty 2

## 2024-04-04 MED ORDER — OXYCODONE-ACETAMINOPHEN 5-325 MG PO TABS: 1.0000 | ORAL_TABLET | Freq: Four times a day (QID) | ORAL | 0 refills | Status: DC | PRN

## 2024-04-04 NOTE — Discharge Instructions (Addendum)
 Unfortunately your x-ray shows that you have had pretty severe damage to your hip over the last number of years, none of this is new, you may need to have a surgical fix of the hip if it causes ongoing pain but it does not need to be done emergently.  I have given you a prescription for a walker, to help you walk and a wheelchair as well.  Call the orthopedic office Monday morning to make a follow up -see somebody local to where you live or I have included the phone numbers for our local orthopedist here in town.  Opiate medications such as Percocet or Vicodin or morphine  are very strong pain medications that are also very addictive if they are taken for too long, even a single dose can predispose someone to become addicted so be very careful when taking this medication.  You should take the smallest amount possible to relieve your pain, these medications may cause constipation or nausea, please follow-up with your doctor if you are having the need for ongoing pain control despite these medications.  Additionally please be aware that these medications may cause sedation or sleepiness or alter judgment so you should not take this if you are driving a vehicle or operating heavy machinery or taking care of small children.  Thank you for allowing us  to treat you in the emergency department today.  After reviewing your examination and potential testing that was done it appears that you are safe to go home.  I would like for you to follow-up with your doctor within the next several days, have them obtain your records and follow-up with them to review all potential tests and results from your visit.  If you should develop severe or worsening symptoms return to the emergency department immediately

## 2024-04-04 NOTE — ED Provider Notes (Signed)
 I have examined this patient in person, he has had multiple years of pain in his left hip which she states has been around 8 or 10 years, unfortunately last night he had a fall striking the left side of his head, he was able to get himself back up in the bed but this morning had difficulty walking.  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  I discussed his care with the orthopedist on-call Dr. Barton, he recommends that the patient be seen as an outpatient to have consideration for some type of arthroplasty or hemiarthroplasty.  The patient has been able to ambulate in the ED with a walker.  He will be given some pain medication for home and follow-up with orthopedist.  The patient is agreeable and has 2 family members at home that live with him.   [name], MD 04/04/24 1739

## 2024-04-04 NOTE — ED Provider Notes (Signed)
 [place] [facility] AT [facility] Provider Note  CSN: [id number] Arrival date & time: 04/04/24 1121  Chief Complaint(s) Fall  HPI Evan Price is a 78 y.o. male with past medical history as below, significant for chronic back, HLD, GERD, asthma who presents to the ED with complaint of fall  Patient with apparent mechanical fall last night, tripped over something on the floor.  He is somewhat amnesic to the fall, does not think he had LOC. Did have some etoh last night.  Fell onto his left hip and hit his head on the ground.  Able to get up off the ground, unable bear weight to his left hip.  He has chronic pain to his left hip but was able to ambulate.  He has no headache, no nausea or vomiting, no numbness or tingling to his extremities.  No chest pain or dyspnea, no abdominal pain.  No change in bowel or bladder function.  No blood thinners  Past Medical History Past Medical History:  Diagnosis Date   Asthma    Chronic back pain 2015   Dyslipidemia    GERD (gastroesophageal reflux disease)    Pneumonia    Patient Active Problem List   Diagnosis Date Noted   Lobar pneumonia (HCC) 01/30/2019   Severe protein-calorie malnutrition (HCC) 01/29/2019   Sepsis due to undetermined organism (HCC) 01/29/2019   Iron deficiency anemia due to chronic blood loss 01/29/2019   Iron deficiency anemia 01/29/2019   CKD (chronic kidney disease) stage 2, GFR 60-89 ml/min 01/29/2019   Hypotension 01/28/2019   Anemia 01/28/2019   Thrombocytosis 01/28/2019   Asthma 01/28/2019   Colonoscopy refused 01/28/2019   Dyslipidemia 01/28/2019   Screening PSA (prostate specific antigen) 01/28/2019   Legionella pneumonia (HCC) 06/20/2018   CAP (community acquired pneumonia) 06/16/2018   Dehydration 06/16/2018   AKI (acute kidney injury) (HCC) 06/16/2018   Sepsis (HCC) 06/16/2018   Chronic back pain 06/16/2018   GERD (gastroesophageal reflux disease) 06/16/2018   Home  Medication(s) Prior to Admission medications   Medication Sig Start Date End Date Taking? Authorizing Provider  albuterol  (PROVENTIL ) (2.5 MG/3ML) 0.083% nebulizer solution Take 2.5 mg by nebulization every 6 (six) hours as needed for wheezing or shortness of breath.  04/15/18   [provider]  ferrous sulfate  325 (65 FE) MG tablet Take 1 tablet (325 mg total) by mouth 2 (two) times daily with a meal. 02/03/19   Tat, Alm, MD  gabapentin  (NEURONTIN ) 400 MG capsule Take 1 capsule by mouth 3 (three) times daily. 06/12/18   [provider]  HYDROcodone -acetaminophen  (NORCO/VICODIN) 5-325 MG tablet Take 1 tablet by mouth every 6 (six) hours as needed for moderate pain (pain score 4-6). 06/12/18   [provider]  levofloxacin  (LEVAQUIN ) 750 MG tablet Take 1 tablet (750 mg total) by mouth daily. 02/03/19   Evonnie Alm, MD  sildenafil (REVATIO) 20 MG tablet Take 40-80 mg by mouth as needed (erectile dysfunction). Take 1 hour prior to sexual activity, do not exceed 100 mg in one 24 hr period. 01/21/24   [provider]  VENTOLIN  HFA 108 (90 Base) MCG/ACT inhaler Inhale 1-2 puffs into the lungs every 6 (six) hours as needed for wheezing or shortness of breath. 05/27/18   [provider]  vitamin B-12 (CYANOCOBALAMIN ) 500 MCG tablet Take 1 tablet (500 mcg total) by mouth daily. 02/02/19   Evonnie Alm, MD  Past Surgical History Past Surgical History:  Procedure Laterality Date   APPENDECTOMY     CHOLECYSTECTOMY     Family History Family History  Problem Relation Age of Onset   Diabetes Mother    Hypertension Father     Social History Social History   Tobacco Use   Smoking status: Never   Smokeless tobacco: Never  Vaping Use   Vaping status: Never Used  Substance Use Topics   Alcohol use: Yes    Alcohol/week: 3.0 standard drinks  of alcohol    Types: 3 Cans of beer per week    Comment: 3 cans beer/ week   Drug use: Never   Allergies Dust mite extract and Keflex [cephalexin]  Review of Systems A thorough review of systems was obtained and all systems are negative except as noted in the HPI and PMH.   Physical Exam Vital Signs  I have reviewed the triage vital signs BP (!) 128/58 (BP Location: Right Arm)   Pulse 88   Temp 97.9 F (36.6 C) (Oral)   Resp 18   Ht 5' 11 (1.803 m)   Wt 70.8 kg   SpO2 98%   BMI 21.76 kg/m  Physical Exam Vitals and nursing note reviewed.  Constitutional:      General: He is not in acute distress.    Appearance: He is well-developed.  HENT:     Head: Normocephalic.     Right Ear: External ear normal.     Left Ear: External ear normal.     Mouth/Throat:     Mouth: Mucous membranes are moist.  Eyes:     General: No scleral icterus. Cardiovascular:     Rate and Rhythm: Normal rate and regular rhythm.     Pulses: Normal pulses.     Heart sounds: Normal heart sounds.  Pulmonary:     Effort: Pulmonary effort is normal. No respiratory distress.     Breath sounds: Normal breath sounds.  Abdominal:     General: Abdomen is flat.     Palpations: Abdomen is soft.     Tenderness: There is no abdominal tenderness.  Musculoskeletal:     Cervical back: No rigidity.     Right lower leg: No edema.     Left lower leg: No edema.       Legs:     Comments: LE NVI Limited ROM to LLE 2/2 pain  Skin:    General: Skin is warm and dry.     Capillary Refill: Capillary refill takes less than 2 seconds.  Neurological:     Mental Status: He is alert and oriented to person, place, and time.     GCS: GCS eye subscore is 4. GCS verbal subscore is 5. GCS motor subscore is 6.     Cranial Nerves: Cranial nerves 2-12 are intact. No dysarthria.     Sensory: Sensation is intact.     Motor: Motor function is intact. No tremor.     Coordination: Coordination is intact.     Comments: Gait  testing deferred secondary to patient safety.   Psychiatric:        Mood and Affect: Mood normal.        Behavior: Behavior normal.     ED Results and Treatments Labs (all labs ordered are listed, but only abnormal results are displayed) Labs Reviewed  CBC WITH DIFFERENTIAL/PLATELET - Abnormal; Notable for the following components:      Result Value   WBC 14.0 (*)  Hemoglobin 12.3 (*)    RDW 16.0 (*)    Neutro Abs 11.9 (*)    All other components within normal limits  BASIC METABOLIC PANEL WITH GFR - Abnormal; Notable for the following components:   Glucose, Bld 104 (*)    Creatinine, Ser 1.37 (*)    GFR, Estimated 53 (*)    All other components within normal limits  URINALYSIS, ROUTINE W REFLEX MICROSCOPIC                                                                                                                          Radiology CT Hip Left Wo Contrast Result Date: 04/04/2024 CLINICAL DATA:  Trauma, fracture suspected. EXAM: CT OF THE LEFT HIP WITHOUT CONTRAST TECHNIQUE: Multidetector CT imaging of the left hip was performed according to the standard protocol. Multiplanar CT image reconstructions were also generated. RADIATION DOSE REDUCTION: This exam was performed according to the departmental dose-optimization program which includes automated exposure control, adjustment of the mA and/or kV according to patient size and/or use of iterative reconstruction technique. COMPARISON:  Left hip x-ray 04/04/2024. FINDINGS: Bones/Joint/Cartilage There is severe degenerative of the left hip joint with bone survey shin superiorly. Subchondral cystic change, and osteophytes are present. There is collapse and fragmentation of the humeral head suspicious for avascular necrosis. Majority of these findings appear chronic, but the mediastinum acute fractures along the central portion the collapse. No other fractures are visualized. There is no other focal osseous lesion. Hip joint effusion is  present. Ligaments Not well evaluated on this, noncontrast study. Muscles and Tendons No focal abnormality. Soft tissues No acute abnormality. Sigmoid colon diverticula are present. Prostate gland is enlarged. IMPRESSION: 1. 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 Majority of these findings appear chronic, but the mediastinum acute fractures along the central portion of the collapse. 2. Hip joint effusion. 3. Sigmoid colon diverticulosis. 4. Prostatomegaly. Electronically Signed   By: Greig Pique M.D.   On: 04/04/2024 15:32   CT Head Wo Contrast Result Date: 04/04/2024 CLINICAL DATA:  Clemens.  Hit head. EXAM: CT HEAD WITHOUT CONTRAST CT CERVICAL SPINE WITHOUT CONTRAST TECHNIQUE: Multidetector CT imaging of the head and cervical spine was performed following the standard protocol without intravenous contrast. Multiplanar CT image reconstructions of the cervical spine were also generated. RADIATION DOSE REDUCTION: This exam was performed according to the departmental dose-optimization program which includes automated exposure control, adjustment of the mA and/or kV according to patient size and/or use of iterative reconstruction technique. COMPARISON:  None Available. FINDINGS: CT HEAD FINDINGS Brain: Age related cerebral atrophy, ventriculomegaly and periventricular white matter disease. No extra-axial fluid collections are identified. No CT findings for acute hemispheric infarction or intracranial hemorrhage. No mass lesions. The brainstem and cerebellum are normal. Vascular: No hyperdense vessel or unexpected calcification. Skull: No acute skull fracture or bone lesion. Sinuses/Orbits: The paranasal sinuses and mastoid air cells are  clear. The globes are intact. Other: Marked soft tissue swelling involving the left ear likely related to trauma. CT CERVICAL SPINE FINDINGS Motion degraded examination. Alignment:  Normal overall alignment of the cervical vertebral bodies. Skull base and vertebrae: No acute fracture. No primary bone lesion or focal pathologic process. Soft tissues and spinal canal: Choose 1 Disc levels: Fairly generous spinal canal. 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 Upper chest: Bilateral apical lung lesions. The left-sided abnormality a appears to be the sequela of a prior cavitary pneumonia. The right-sided nodules are indeterminate. Correlation with chest CT suggested. Other: None IMPRESSION: 1. Age related cerebral atrophy, ventriculomegaly and periventricular white matter disease. 2. No acute intracranial findings or skull fracture. 3. Marked soft tissue swelling involving the left ear likely related to trauma. 4. Motion degraded cervical spine CT but no acute fracture. 5. 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 6. Bilateral apical lung lesions. The left-sided abnormality appears to be the sequela of a prior cavitary pneumonia. The right-sided nodules are indeterminate. Correlation with chest CT suggested. Electronically Signed   By: MYRTIS Stammer M.D.   On: 04/04/2024 12:38   CT Cervical Spine Wo Contrast Result Date: 04/04/2024 CLINICAL DATA:  [place].  Hit head. EXAM: CT HEAD WITHOUT CONTRAST CT CERVICAL SPINE WITHOUT CONTRAST TECHNIQUE: Multidetector CT imaging of the head and cervical spine was performed following the standard protocol without intravenous contrast. Multiplanar CT image reconstructions of the cervical spine were also generated. RADIATION DOSE REDUCTION: This exam was performed according to the departmental dose-optimization program which includes automated exposure control, adjustment of the mA and/or kV according to patient size and/or use of iterative reconstruction technique. COMPARISON:  None Available. FINDINGS: CT  HEAD FINDINGS Brain: Age related cerebral atrophy, ventriculomegaly and periventricular white matter disease. No extra-axial fluid collections are identified. No CT findings for acute hemispheric infarction or intracranial hemorrhage. No mass lesions. The brainstem and cerebellum are normal. Vascular: No hyperdense vessel or unexpected calcification. Skull: No acute skull fracture or bone lesion. Sinuses/Orbits: The paranasal sinuses and mastoid air cells are clear. The globes are intact. Other: Marked soft tissue swelling involving the left ear likely related to trauma. CT CERVICAL SPINE FINDINGS Motion degraded examination. Alignment: Normal overall alignment of the cervical vertebral bodies. Skull base and vertebrae: No acute fracture. No primary bone lesion or focal pathologic process. Soft tissues and spinal canal: Choose 1 Disc levels: Fairly generous spinal canal. 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 Upper chest: Bilateral apical lung lesions. The left-sided abnormality a appears to be the sequela of a prior cavitary pneumonia. The right-sided nodules are indeterminate. Correlation with chest CT suggested. Other: None IMPRESSION: 1. Age related cerebral atrophy, ventriculomegaly and periventricular white matter disease. 2. No acute intracranial findings or skull fracture. 3. Marked soft tissue swelling involving the left ear likely related to trauma. 4. Motion degraded cervical spine CT but no acute fracture. 5. 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 6. Bilateral apical lung lesions. The left-sided abnormality appears to be the sequela of a prior cavitary pneumonia. The right-sided nodules are indeterminate. Correlation with chest CT suggested. Electronically Signed   By: MYRTIS Stammer M.D.   On: 04/04/2024 12:38   DG Knee Complete 4 Views  Left Result Date: 04/04/2024 CLINICAL DATA:  pain and fall EXAM: LEFT KNEE - COMPLETE 4+ VIEW COMPARISON:  None Available. FINDINGS: Osteopenia. No acute fracture or dislocation. Minimal osteophyte formation along the medial compartment. No area of erosion or osseous destruction. No unexpected radiopaque foreign body. Soft tissues are unremarkable. IMPRESSION: 1. No acute fracture or dislocation. 2. If persistent concern for nondisplaced fracture, recommend dedicated cross-sectional imaging. Electronically Signed   By: Corean Salter M.D.   On: 04/04/2024 12:37   DG Hip Unilat W or Wo Pelvis 2-3 Views Left Result Date: 04/04/2024 CLINICAL DATA:  pain and fall EXAM: DG HIP (WITH OR WITHOUT PELVIS) 2-3V LEFT COMPARISON:  None Available. FINDINGS: Osteopenia. There is mottled sclerosis of the femoral head with flattening of the superior aspect of the femoral head. There is osseous remodeling of the acetabulum. No definitive acute displaced fracture. Degenerative changes of the lower lumbar spine. 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 If there is a persistent clinical concern for nondisplaced hip or pelvic fracture or if further imaging is desired, recommend dedicated pelvic CT or MRI. Electronically Signed   By: Corean Salter M.D.   On: 04/04/2024 12:36    Pertinent labs & imaging results that were available during my care of the patient were reviewed by me and considered in my medical decision making (see MDM for details).  Medications Ordered in ED Medications  sodium chloride  0.9 % bolus 1,000 mL (0 mLs Intravenous Stopped 04/04/24 1635)  morphine  (PF) 4 MG/ML injection 4 mg (4 mg Intravenous Given 04/04/24 1522)  ondansetron  (ZOFRAN ) injection 4 mg (4 mg Intravenous Given 04/04/24 1522)  HYDROmorphone  (DILAUDID ) injection 0.5 mg (0.5 mg Intravenous Given 04/04/24 1632)                                                                                                                                      Procedures Procedures  (including critical care time)  Medical Decision Making / ED Course    Medical Decision Making:    Demonie Kassa is a 78 y.o. male with past medical history as below, significant for chronic back, HLD, GERD, asthma who presents to the ED with complaint of fall. The complaint involves an extensive differential diagnosis and also carries with it a high risk of complications and morbidity.  Serious etiology was considered. Ddx includes but is not limited to: Differential diagnoses for head trauma includes subdural hematoma, epidural hematoma, acute concussion, traumatic subarachnoid hemorrhage, cerebral contusions, fracture, dislocation, contusion etc.   Complete initial physical exam performed, notably the patient was in no acute distress.    Reviewed and confirmed nursing documentation for past medical history, family history, social history.  Vital signs reviewed.    Fall  Head injury Hip injury > - likely mechanical fall, pt is somewhat amnesic, +etoh - CTH / ct cervical was negative - knee left xr and hip was  obtained > ct left hip; concern for possible fracture - Patient with intractable pain to his left hip. - Patient recommended for admission   Clinical Course as of 04/04/24 1644  Sat Apr 04, 2024  1435 Creatinine(!): 1.37 Mild elev from prior, has not had fluids today, will give fluids [SG]    Clinical Course User Index [SG] Elnor Jayson LABOR, DO     Handoff dr [email or web address]                Additional history obtained: -Additional history obtained from na -External records from outside source obtained and reviewed including: Chart review including previous notes, labs, imaging, consultation notes including  Home medications   Lab Tests: -I ordered, reviewed, and interpreted labs.   The pertinent results include:   Labs Reviewed  CBC WITH  DIFFERENTIAL/PLATELET - Abnormal; Notable for the following components:      Result Value   WBC 14.0 (*)    Hemoglobin 12.3 (*)    RDW 16.0 (*)    Neutro Abs 11.9 (*)    All other components within normal limits  BASIC METABOLIC PANEL WITH GFR - Abnormal; Notable for the following components:   Glucose, Bld 104 (*)    Creatinine, Ser 1.37 (*)    GFR, Estimated 53 (*)    All other components within normal limits  URINALYSIS, ROUTINE W REFLEX MICROSCOPIC    Notable for wbc + likely 2/2 trauma  EKG   EKG Interpretation Date/Time:    Ventricular Rate:    PR Interval:    QRS Duration:    QT Interval:    QTC Calculation:   R Axis:      Text Interpretation:           Imaging Studies ordered: I ordered imaging studies including ct head/cervical/hip, xr hip/ chest I independently visualized the following imaging with scope of interpretation limited to determining acute life threatening conditions related to emergency care; findings noted above I agree with the radiologist interpretation If any imaging was obtained with contrast I closely monitored patient for any possible adverse reaction a/w contrast administration in the emergency department   Medicines ordered and prescription drug management: Meds ordered this encounter  Medications   sodium chloride  0.9 % bolus 1,000 mL   morphine  (PF) 4 MG/ML injection 4 mg   ondansetron  (ZOFRAN ) injection 4 mg   HYDROmorphone  (DILAUDID ) injection 0.5 mg    -I have reviewed the patients home medicines and have made adjustments as needed   Consultations Obtained: na   Cardiac Monitoring: Continuous pulse oximetry interpreted by myself,  99% on ra.    Social Determinants of Health:  Diagnosis or treatment significantly limited by social determinants of health: na   Reevaluation: After the interventions noted above, I reevaluated the patient and found that they have improved  Co morbidities that complicate the patient  evaluation  Past Medical History:  Diagnosis Date   Asthma    Chronic back pain 2015   Dyslipidemia    GERD (gastroesophageal reflux disease)    Pneumonia       Dispostion: Disposition decision including need for hospitalization was considered, and patient disposition pending at time of sign out.    Final Clinical Impression(s) / ED Diagnoses Final diagnoses:  Fall, initial encounter        Elnor Jayson LABOR, DO 04/04/24 1644

## 2024-04-04 NOTE — ED Triage Notes (Addendum)
 Patient reports he has been ahving trouble with his left hip and knee for awhile.  But family reports they think he fell last night but patient doesn't remember falling.  Patient has small abrasion to head and swelling and bruising to left ear.  Patient did have some alcohol last night and takes hydrocodone  and gabapentin   Family found him in his room attempting to walk around but was unable to bear weight on left leg.  Patient doesn't remember even getting himself up.

## 2024-04-04 NOTE — ED Notes (Signed)
 Family states pt normally drags his leg when he walks but today the drag is much worse.

## 2024-07-08 NOTE — H&P (Cosign Needed Addendum)
 TOTAL HIP ADMISSION H&P  Patient is admitted for left total hip arthroplasty.  Subjective:  Chief Complaint: Left hip pain  HPI: Evan Price, 78 y.o. male, has a history of pain and functional disability in the left hip due to arthritis and patient has failed non-surgical conservative treatments for greater than 12 weeks to include use of assistive devices and activity modification. Onset of symptoms was gradual, starting several years ago with gradually worsening course since that time. The patient noted no past surgery on the left hip. Patient currently rates pain in the left hip at 8 out of 10 with activity. Patient has pain that interfers with activities of daily living and pain with passive range of motion. Patient has evidence of periarticular osteophytes and joint space narrowing by imaging studies. This condition presents safety issues increasing the risk of falls. There is no current active infection.  Patient Active Problem List   Diagnosis Date Noted   Lobar pneumonia 01/30/2019   Severe protein-calorie malnutrition 01/29/2019   Sepsis due to undetermined organism (HCC) 01/29/2019   Iron deficiency anemia due to chronic blood loss 01/29/2019   Iron deficiency anemia 01/29/2019   CKD (chronic kidney disease) stage 2, GFR 60-89 ml/min 01/29/2019   Hypotension 01/28/2019   Anemia 01/28/2019   Thrombocytosis 01/28/2019   Asthma 01/28/2019   Colonoscopy refused 01/28/2019   Dyslipidemia 01/28/2019   Screening PSA (prostate specific antigen) 01/28/2019   Legionella pneumonia (HCC) 06/20/2018   CAP (community acquired pneumonia) 06/16/2018   Dehydration 06/16/2018   AKI (acute kidney injury) 06/16/2018   Sepsis (HCC) 06/16/2018   Chronic back pain 06/16/2018   GERD (gastroesophageal reflux disease) 06/16/2018    Past Medical History:  Diagnosis Date   Asthma    Chronic back pain 2015   Dyslipidemia    GERD (gastroesophageal reflux disease)    Pneumonia     Past  Surgical History:  Procedure Laterality Date   APPENDECTOMY     CHOLECYSTECTOMY      Prior to Admission medications   Medication Sig Start Date End Date Taking? Authorizing Provider  acetaminophen  (TYLENOL ) 500 MG tablet Take 1,000 mg by mouth every 6 (six) hours as needed for mild pain (pain score 1-3).    [provider]  albuterol  (PROVENTIL ) (2.5 MG/3ML) 0.083% nebulizer solution Take 2.5 mg by nebulization every 6 (six) hours as needed for wheezing or shortness of breath.  04/15/18   [provider]  gabapentin  (NEURONTIN ) 400 MG capsule Take 1 capsule by mouth 3 (three) times daily. 06/12/18   [provider]  oxyCODONE -acetaminophen  (PERCOCET/ROXICET) 5-325 MG tablet Take 1 tablet by mouth every 6 (six) hours as needed for severe pain (pain score 7-10). 04/04/24   [name], MD  sildenafil (REVATIO) 20 MG tablet Take 40-80 mg by mouth as needed (erectile dysfunction). Take 1 hour prior to sexual activity, do not exceed 100 mg in one 24 hr period. 01/21/24   [provider]  VENTOLIN  HFA 108 (90 Base) MCG/ACT inhaler Inhale 1-2 puffs into the lungs every 6 (six) hours as needed for wheezing or shortness of breath. 05/27/18   [provider]    Allergies  Allergen Reactions   Dust Mite Extract Other (See Comments)    Unknown    Keflex [Cephalexin] Other (See Comments)    Unknown     Social History   Socioeconomic History   Marital status: Divorced    Spouse name: Not on file   Number of children: Not on  file   Years of education: Not on file   Highest education level: Not on file  Occupational History   Not on file  Tobacco Use   Smoking status: Never   Smokeless tobacco: Never  Vaping Use   Vaping status: Never Used  Substance and Sexual Activity   Alcohol use: Yes    Alcohol/week: 3.0 standard drinks of alcohol    Types: 3 Cans of beer per week    Comment: 3 cans beer/ week   Drug use: Never   Sexual activity: Not on file   Other Topics Concern   Not on file  Social History Narrative   Not on file   Social Drivers of Health   Financial Resource Strain: Low Risk  (06/16/2018)   Overall Financial Resource Strain (CARDIA)    Difficulty of Paying Living Expenses: Not hard at all  Food Insecurity: No Food Insecurity (06/16/2018)   Hunger Vital Sign    Worried About Running Out of Food in the Last Year: Never true    Ran Out of Food in the Last Year: Never true  Transportation Needs: No Transportation Needs (06/16/2018)   PRAPARE - Administrator, Civil Service (Medical): No    Lack of Transportation (Non-Medical): No  Physical Activity: Inactive (06/16/2018)   Exercise Vital Sign    Days of Exercise per Week: 0 days    Minutes of Exercise per Session: 0 min  Stress: No Stress Concern Present (06/16/2018)   [employer] of Occupational Health - Occupational Stress Questionnaire    Feeling of Stress : Not at all  Social Connections: Moderately Integrated (06/16/2018)   Social Connection and Isolation Panel    Frequency of Communication with Friends and Family: More than three times a week    Frequency of Social Gatherings with Friends and Family: Three times a week    Attends Religious Services: More than 4 times per year    Active Member of Clubs or Organizations: Yes    Attends Banker Meetings: Patient declined    Marital Status: Divorced  Intimate Partner Violence: Not At Risk (06/16/2018)   Humiliation, Afraid, Rape, and Kick questionnaire    Fear of Current or Ex-Partner: No    Emotionally Abused: No    Physically Abused: No    Sexually Abused: No    Tobacco Use: Low Risk  (04/04/2024)   Patient History    Smoking Tobacco Use: Never    Smokeless Tobacco Use: Never    Passive Exposure: Not on file   Social History   Substance and Sexual Activity  Alcohol Use Yes   Alcohol/week: 3.0 standard drinks of alcohol   Types: 3 Cans of beer per week   Comment: 3 cans  beer/ week    Family History  Problem Relation Age of Onset   Diabetes Mother    Hypertension Father     ROS   Objective:  Physical Exam: Well-developed male, alert and oriented, in no apparent distress.    Right hip: Normal range of motion, no discomfort.    Left hip: Flexion up to approximately 100 degrees, no internal rotation, 10 degrees of external rotation, 20 degrees of abduction.    Gait: Significantly antalgic pattern.    Left greater trochanter: Non-tender.   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   Right hip appears normal.  Assessment/Plan:  End stage arthritis, left hip  The patient history, physical examination, clinical judgement of the provider and imaging studies are consistent with end stage degenerative joint disease of the left hip and total hip arthroplasty is deemed medically necessary. The treatment options including medical management, injection therapy, arthroscopy and arthroplasty were discussed at length. The risks and benefits of total hip arthroplasty were presented and reviewed. The risks due to aseptic loosening, infection, stiffness, dislocation/subluxation, thromboembolic complications and other imponderables were discussed. The patient acknowledged the explanation, agreed to proceed with the plan and consent was signed. Patient is being admitted for inpatient treatment for surgery, pain control, PT, OT, prophylactic antibiotics, VTE prophylaxis, progressive ambulation and ADLs and discharge planning.The patient is planning to be discharged home.   Patient's anticipated LOS is less than 2 midnights, meeting these requirements: - Younger than 9 - Lives within 1 hour of care - Has a competent adult at home to recover with post-op recover - NO history of  - Diabetes  - Coronary Artery Disease  -  Heart failure  - Heart attack  - Stroke  - DVT/VTE  - Cardiac arrhythmia  - Respiratory Failure/COPD  - Renal failure  - Anemia  - Advanced Liver disease  Therapy Plans: HEP  Disposition: Home with son Planned DVT Prophylaxis: Aspirin 81mg  BID  DME Needed: none PCP: Devere Seaman, MD (clearance received) Pulmonology: Alm Sharps, PA (clearance received)  TXA: IV  Allergies: Cephalexin (rxn unknown) Metal Allergies: none Anesthesia Concerns: worried about spinal, wanting general anesthesia  BMI: 23.7  Last HgbA1c: not diabetic  Pharmacy: [facility]   Other: - Taking Norco 10mg  2-3x times daily under pain management. Discussed Norco 10mg  q4h prn with breakthrough oxycodone  for post-operative pain.  - Patient was instructed on what medications to stop prior to surgery. - Follow-up visit in 2 weeks with Dr. Melodi - Begin physical therapy following surgery - Pre-operative lab work as pre-surgical testing - Prescriptions will be provided in hospital at time of discharge  Corean Sender, PA-C Orthopedic Surgery [facility] [place]

## 2024-07-16 NOTE — Patient Instructions (Addendum)
 SURGICAL WAITING ROOM VISITATION  Patients having surgery or a procedure may have no more than 2 support people in the waiting area - these visitors may rotate.    Children under the age of 63 must have an adult with them who is not the patient.  Visitors with respiratory illnesses are discouraged from visiting and should remain at home.  If the patient needs to stay at the hospital during part of their recovery, the visitor guidelines for inpatient rooms apply. Pre-op nurse will coordinate an appropriate time for 1 support person to accompany patient in pre-op.  This support person may not rotate.    Please refer to the [facility] website for the visitor guidelines for Inpatients (after your surgery is over and you are in a regular room).       Your procedure is scheduled on:   07/29/2024    Report to [facility] Main Entrance    Report to admitting at   1045AM   Call this number if you have problems the morning of surgery [phone]   Do not eat food :After Midnight.   After Midnight you may have the following liquids until ___ 1015___ AM  DAY OF SURGERY  Water Non-Citrus Juices (without pulp, NO RED-Apple, White grape, White cranberry) Black Coffee (NO MILK/CREAM OR CREAMERS, sugar ok)  Clear Tea (NO MILK/CREAM OR CREAMERS, sugar ok) regular and decaf                             Plain Jell-O (NO RED)                                           Fruit ices (not with fruit pulp, NO RED)                                     Popsicles (NO RED)                                                               Sports drinks like Gatorade (NO RED)                   The day of surgery:  Drink ONE (1) Pre-Surgery Clear Ensure or G2 at  1015 AM the morning of surgery. Drink in one sitting. Do not sip.  This drink was given to you during your hospital  pre-op appointment visit. Nothing else to drink after completing the  Pre-Surgery Clear Ensure or G2.          If you have  questions, please contact your surgeon's office.   FOLLOW BOWEL PREP AND ANY ADDITIONAL PRE OP INSTRUCTIONS YOU RECEIVED FROM YOUR SURGEON'S OFFICE!!!     Oral Hygiene is also important to reduce your risk of infection.                                    Remember - BRUSH YOUR TEETH THE MORNING OF SURGERY WITH YOUR REGULAR  TOOTHPASTE  DENTURES WILL BE REMOVED PRIOR TO SURGERY PLEASE DO NOT APPLY Poly grip OR ADHESIVES!!!   Do NOT smoke after Midnight   Stop all vitamins and herbal supplements 7 days before surgery.   Take these medicines the morning of surgery with A SIP OF WATER:  nebulizer if needed, inhalers as usual and bring, gabapentin    DO NOT TAKE ANY ORAL DIABETIC MEDICATIONS DAY OF YOUR SURGERY  Bring CPAP mask and tubing day of surgery.                              You may not have any metal on your body including hair pins, jewelry, and body piercing             Do not wear make-up, lotions, powders, perfumes/cologne, or deodorant  Do not wear nail polish including gel and S&S, artificial/acrylic nails, or any other type of covering on natural nails including finger and toenails. If you have artificial nails, gel coating, etc. that needs to be removed by a nail salon please have this removed prior to surgery or surgery may need to be canceled/ delayed if the surgeon/ anesthesia feels like they are unable to be safely monitored.   Do not shave  48 hours prior to surgery.               Men may shave face and neck.   Do not bring valuables to the hospital. [facility] IS NOT             RESPONSIBLE   FOR VALUABLES.   Contacts, glasses, dentures or bridgework may not be worn into surgery.   Bring small overnight bag day of surgery.   DO NOT BRING YOUR HOME MEDICATIONS TO THE HOSPITAL. PHARMACY WILL DISPENSE MEDICATIONS LISTED ON YOUR MEDICATION LIST TO YOU DURING YOUR ADMISSION IN THE HOSPITAL!    Patients discharged on the day of surgery will not be allowed to drive  home.  Someone NEEDS to stay with you for the first 24 hours after anesthesia.   Special Instructions: Bring a copy of your healthcare power of attorney and living will documents the day of surgery if you haven't scanned them before.              Please read over the following fact sheets you were given: IF YOU HAVE QUESTIONS ABOUT YOUR PRE-OP INSTRUCTIONS PLEASE CALL [phone].   If you received a COVID test during your pre-op visit  it is requested that you wear a mask when out in public, stay away from anyone that may not be feeling well and notify your surgeon if you develop symptoms. If you test positive for Covid or have been in contact with anyone that has tested positive in the last 10 days please notify you surgeon.      Pre-operative 4 CHG Bath Instructions   You can play a key role in reducing the risk of infection after surgery. Your skin needs to be as free of germs as possible. You can reduce the number of germs on your skin by washing with CHG (chlorhexidine  gluconate) soap before surgery. CHG is an antiseptic soap that kills germs and continues to kill germs even after washing.   DO NOT use if you have an allergy to chlorhexidine /CHG or antibacterial soaps. If your skin becomes reddened or irritated, stop using the CHG and notify one of our RNs at [phone].   Please  shower with the CHG soap starting 4 days before surgery using the following schedule:     Please keep in mind the following:  DO NOT shave, including legs and underarms, starting the day of your first shower.   You may shave your face at any point before/day of surgery.  Place clean sheets on your bed the day you start using CHG soap. Use a clean washcloth (not used since being washed) for each shower. DO NOT sleep with pets once you start using the CHG.   CHG Shower Instructions:  If you choose to wash your hair and private area, wash first with your normal shampoo/soap.  After you use shampoo/soap, rinse  your hair and body thoroughly to remove shampoo/soap residue.  Turn the water OFF and apply about 3 tablespoons (45 ml) of CHG soap to a CLEAN washcloth.  Apply CHG soap ONLY FROM YOUR NECK DOWN TO YOUR TOES (washing for 3-5 minutes)  DO NOT use CHG soap on face, private areas, open wounds, or sores.  Pay special attention to the area where your surgery is being performed.  If you are having back surgery, having someone wash your back for you may be helpful. Wait 2 minutes after CHG soap is applied, then you may rinse off the CHG soap.  Pat dry with a clean towel  Put on clean clothes/pajamas   If you choose to wear lotion, please use ONLY the CHG-compatible lotions on the back of this paper.     Additional instructions for the day of surgery: DO NOT APPLY any lotions, deodorants, cologne, or perfumes.   Put on clean/comfortable clothes.  Brush your teeth.  Ask your nurse before applying any prescription medications to the skin.      CHG Compatible Lotions   Aveeno Moisturizing lotion  Cetaphil Moisturizing Cream  Cetaphil Moisturizing Lotion  Clairol Herbal Essence Moisturizing Lotion, Dry Skin  Clairol Herbal Essence Moisturizing Lotion, Extra Dry Skin  Clairol Herbal Essence Moisturizing Lotion, Normal Skin  Curel Age Defying Therapeutic Moisturizing Lotion with Alpha Hydroxy  Curel Extreme Care Body Lotion  Curel Soothing Hands Moisturizing Hand Lotion  Curel Therapeutic Moisturizing Cream, Fragrance-Free  Curel Therapeutic Moisturizing Lotion, Fragrance-Free  Curel Therapeutic Moisturizing Lotion, Original Formula  Eucerin Daily Replenishing Lotion  Eucerin Dry Skin Therapy Plus Alpha Hydroxy Crme  Eucerin Dry Skin Therapy Plus Alpha Hydroxy Lotion  Eucerin Original Crme  Eucerin Original Lotion  Eucerin Plus Crme Eucerin Plus Lotion  Eucerin TriLipid Replenishing Lotion  Keri Anti-Bacterial Hand Lotion  Keri Deep Conditioning Original Lotion Dry Skin Formula Softly  Scented  Keri Deep Conditioning Original Lotion, Fragrance Free Sensitive Skin Formula  Keri Lotion Fast Absorbing Fragrance Free Sensitive Skin Formula  Keri Lotion Fast Absorbing Softly Scented Dry Skin Formula  Keri Original Lotion  Keri Skin Renewal Lotion Keri Silky Smooth Lotion  Keri Silky Smooth Sensitive Skin Lotion  Nivea Body Creamy Conditioning Oil  Nivea Body Extra Enriched Teacher, Adult Education Moisturizing Lotion Nivea Crme  Nivea Skin Firming Lotion  NutraDerm 30 Skin Lotion  NutraDerm Skin Lotion  NutraDerm Therapeutic Skin Cream  NutraDerm Therapeutic Skin Lotion  ProShield Protective Hand Cream  Provon moisturizing lotion

## 2024-07-16 NOTE — Progress Notes (Addendum)
 Anesthesia Review:  PCP: Evan Price  most recent clearance 07/08/24 in Media Tab.  Cardiologist : none   PPM/ ICD: Device Orders: Rep Notified:  Chest x-ray : EKG : 04/04/24  Echo : 2020  Stress test: Cardiac Cath :   Activity level: can do a flight of stairs without difficulty  Sleep Study/ CPAP : none  Fasting Blood Sugar :      / Checks Blood Sugar -- times a day:     Vancomycin    Blood Thinner/ Instructions /Last Dose: ASA / Instructions/ Last Dose :

## 2024-07-21 ENCOUNTER — Encounter (HOSPITAL_COMMUNITY): Payer: Self-pay

## 2024-07-21 ENCOUNTER — Other Ambulatory Visit: Payer: Self-pay

## 2024-07-21 ENCOUNTER — Encounter (HOSPITAL_COMMUNITY)
Admission: RE | Admit: 2024-07-21 | Discharge: 2024-07-21 | Disposition: A | Discharge status: Home / Self Care | Source: Ambulatory Visit | Attending: Orthopedic Surgery | Admitting: Orthopedic Surgery

## 2024-07-21 VITALS — BP 149/73 | HR 70 | Temp 98.1°F | Resp 16 | Ht 71.0 in | Wt 161.0 lb

## 2024-07-21 DIAGNOSIS — Z01812 Encounter for preprocedural laboratory examination: Secondary | ICD-10-CM | POA: Diagnosis not present

## 2024-07-21 DIAGNOSIS — Z01818 Encounter for other preprocedural examination: Secondary | ICD-10-CM | POA: Diagnosis present

## 2024-07-21 HISTORY — DX: Unspecified osteoarthritis, unspecified site: M19.90

## 2024-07-21 LAB — CBC
HCT: 44 % (ref 39.0–52.0)
Hemoglobin: 13.2 g/dL (ref 13.0–17.0)
MCH: 27.5 pg (ref 26.0–34.0)
MCHC: 30 g/dL (ref 30.0–36.0)
MCV: 91.7 fL (ref 80.0–100.0)
Platelets: 246 K/uL (ref 150–400)
RBC: 4.8 MIL/uL (ref 4.22–5.81)
RDW: 14.6 % (ref 11.5–15.5)
WBC: 6.5 K/uL (ref 4.0–10.5)
nRBC: 0 % (ref 0.0–0.2)

## 2024-07-21 LAB — SURGICAL PCR SCREEN
MRSA, PCR: NEGATIVE
Staphylococcus aureus: NEGATIVE

## 2024-07-24 NOTE — Anesthesia Preprocedure Evaluation (Addendum)
 Anesthesia Evaluation  Patient identified by MRN, date of birth, ID band Patient awake    Reviewed: Allergy & Precautions, NPO status , Patient's Chart, lab work & pertinent test results  History of Anesthesia Complications Negative for: history of anesthetic complications  Airway Mallampati: II  TM Distance: >3 FB Neck ROM: Full   Comment: Small mouth opening Dental  (+) Edentulous Upper, Poor Dentition, Dental Advisory Given   Pulmonary asthma (Daily Nebulizer Treatments at Home)    breath sounds clear to auscultation       Cardiovascular (-) angina (-) Past MI and (-) Cardiac Stents (-) dysrhythmias + Valvular Problems/Murmurs (mild AI) AI  Rhythm:Regular Rate:Normal  Echo 2020  1. The left ventricle has normal systolic function, with an ejection  fraction of 55-60%. The cavity size was normal. Left ventricular diastolic  Doppler parameters are consistent with impaired relaxation.   2. The right ventricle has normal systolc function. The cavity was  normal. There is no increase in right ventricular wall thickness. Right  ventricular systolic pressure could not be assessed.   3. The mitral valve is grossly normal.   4. The tricuspid valve was grossly normal.   5. The aortic valve is tricuspid. Aortic valve regurgitation is mild by  color flow Doppler.   6. The aortic root is normal in size and structure.     Neuro/Psych negative neurological ROS  negative psych ROS   GI/Hepatic Neg liver ROS,GERD  Controlled,,  Endo/Other  negative endocrine ROS    Renal/GU negative Renal ROS  negative genitourinary   Musculoskeletal  (+) Arthritis , Osteoarthritis,  Chronic back pain- no surgeries, hydrocodone  10/325   Abdominal   Peds  Hematology negative hematology ROS (+) Hb 13.2, plt 246   Anesthesia Other Findings Chronic Pain Syndrome (Taking Norco 10 mg Q6H)  Reproductive/Obstetrics negative OB ROS                               Anesthesia Physical Anesthesia Plan  ASA: 2  Anesthesia Plan: General   Post-op Pain Management: Tylenol  PO (pre-op)*   Induction: Intravenous  PONV Risk Score and Plan: 2 and TIVA, Propofol infusion and Treatment may vary due to age or medical condition  Airway Management Planned: Oral ETT  Additional Equipment: None  Intra-op Plan:   Post-operative Plan: Extubation in OR  Informed Consent:      Dental advisory given  Plan Discussed with: CRNA and Surgeon  Anesthesia Plan Comments: (Patient refused spinal anesthetic due to his father being never the same after one of those. Plan for GETA. )         Anesthesia Quick Evaluation

## 2024-07-29 ENCOUNTER — Encounter (HOSPITAL_COMMUNITY): Payer: Self-pay | Admitting: Orthopedic Surgery

## 2024-07-29 ENCOUNTER — Encounter (HOSPITAL_COMMUNITY)
Admission: RE | Disposition: A | Discharge status: Home / Self Care | Payer: Self-pay | Source: Home / Self Care | Attending: Orthopedic Surgery

## 2024-07-29 ENCOUNTER — Other Ambulatory Visit: Payer: Self-pay

## 2024-07-29 ENCOUNTER — Ambulatory Visit (HOSPITAL_COMMUNITY)

## 2024-07-29 ENCOUNTER — Ambulatory Visit (HOSPITAL_COMMUNITY): Payer: Self-pay | Admitting: Physician Assistant

## 2024-07-29 ENCOUNTER — Observation Stay (HOSPITAL_COMMUNITY)
Admission: RE | Admit: 2024-07-29 | Discharge: 2024-07-31 | Disposition: A | Discharge status: Home / Self Care | Attending: Orthopedic Surgery | Admitting: Orthopedic Surgery

## 2024-07-29 ENCOUNTER — Ambulatory Visit (HOSPITAL_COMMUNITY): Payer: Self-pay | Admitting: Anesthesiology

## 2024-07-29 ENCOUNTER — Observation Stay (HOSPITAL_COMMUNITY)

## 2024-07-29 DIAGNOSIS — J45909 Unspecified asthma, uncomplicated: Secondary | ICD-10-CM | POA: Insufficient documentation

## 2024-07-29 DIAGNOSIS — Z7982 Long term (current) use of aspirin: Secondary | ICD-10-CM | POA: Diagnosis not present

## 2024-07-29 DIAGNOSIS — M1611 Unilateral primary osteoarthritis, right hip: Principal | ICD-10-CM | POA: Diagnosis present

## 2024-07-29 DIAGNOSIS — M169 Osteoarthritis of hip, unspecified: Principal | ICD-10-CM | POA: Diagnosis present

## 2024-07-29 DIAGNOSIS — M1612 Unilateral primary osteoarthritis, left hip: Secondary | ICD-10-CM | POA: Diagnosis not present

## 2024-07-29 DIAGNOSIS — N182 Chronic kidney disease, stage 2 (mild): Secondary | ICD-10-CM | POA: Insufficient documentation

## 2024-07-29 DIAGNOSIS — M25552 Pain in left hip: Secondary | ICD-10-CM | POA: Diagnosis present

## 2024-07-29 DIAGNOSIS — Z01818 Encounter for other preprocedural examination: Secondary | ICD-10-CM

## 2024-07-29 HISTORY — PX: TOTAL HIP ARTHROPLASTY: SHX124

## 2024-07-29 LAB — BASIC METABOLIC PANEL WITH GFR
Anion gap: 9 (ref 5–15)
BUN: 11 mg/dL (ref 8–23)
CO2: 25 mmol/L (ref 22–32)
Calcium: 9.2 mg/dL (ref 8.9–10.3)
Chloride: 105 mmol/L (ref 98–111)
Creatinine, Ser: 1.2 mg/dL (ref 0.61–1.24)
GFR, Estimated: >60 mL/min (ref >60)
Glucose, Bld: 117 mg/dL — ABNORMAL HIGH (ref 70–99)
Potassium: 5 mmol/L (ref 3.5–5.1)
Sodium: 139 mmol/L (ref 135–145)

## 2024-07-29 LAB — TYPE AND SCREEN
ABO/RH(D): A POS
Antibody Screen: NEGATIVE

## 2024-07-29 LAB — ABO/RH: ABO/RH(D): A POS

## 2024-07-29 SURGERY — ARTHROPLASTY, HIP, TOTAL, ANTERIOR APPROACH
Anesthesia: General | Site: Hip | Laterality: Left

## 2024-07-29 MED ORDER — WATER FOR IRRIGATION, STERILE IR SOLN
Status: DC | PRN
Start: 2024-07-29 — End: 2024-07-29
  Administered 2024-07-29: 2000 mL

## 2024-07-29 MED ORDER — GABAPENTIN 300 MG PO CAPS
600.0000 mg | ORAL_CAPSULE | Freq: Three times a day (TID) | ORAL | Status: DC
  Administered 2024-07-29 – 2024-07-31 (×6): 600 mg via ORAL
  Filled 2024-07-29 (×6): qty 2

## 2024-07-29 MED ORDER — AMISULPRIDE (ANTIEMETIC) 5 MG/2ML IV SOLN: 10.0000 mg | Freq: Once | INTRAVENOUS | Status: DC | PRN

## 2024-07-29 MED ORDER — HYDROMORPHONE HCL 1 MG/ML IJ SOLN
INTRAMUSCULAR | Status: AC
  Filled 2024-07-29: qty 1

## 2024-07-29 MED ORDER — IPRATROPIUM-ALBUTEROL 0.5-2.5 (3) MG/3ML IN SOLN
3.0000 mL | Freq: Once | RESPIRATORY_TRACT | Status: AC
  Administered 2024-07-29: 3 mL via RESPIRATORY_TRACT
  Filled 2024-07-29: qty 3

## 2024-07-29 MED ORDER — MENTHOL 3 MG MT LOZG: 1.0000 | LOZENGE | OROMUCOSAL | Status: DC | PRN

## 2024-07-29 MED ORDER — MAGNESIUM CITRATE PO SOLN: 1.0000 | Freq: Once | ORAL | Status: DC | PRN

## 2024-07-29 MED ORDER — SUGAMMADEX SODIUM 200 MG/2ML IV SOLN
INTRAVENOUS | Status: DC | PRN
  Administered 2024-07-29: 200 mg via INTRAVENOUS

## 2024-07-29 MED ORDER — PHENYLEPHRINE HCL (PRESSORS) 10 MG/ML IV SOLN
INTRAVENOUS | Status: DC | PRN
  Administered 2024-07-29: 160 ug via INTRAVENOUS
  Administered 2024-07-29: 40 ug via INTRAVENOUS
  Administered 2024-07-29: 160 ug via INTRAVENOUS
  Administered 2024-07-29: 200 ug via INTRAVENOUS
  Administered 2024-07-29: 40 ug via INTRAVENOUS

## 2024-07-29 MED ORDER — LACTATED RINGERS IV SOLN: INTRAVENOUS | Status: DC

## 2024-07-29 MED ORDER — KETAMINE HCL 10 MG/ML IJ SOLN
INTRAMUSCULAR | Status: DC | PRN
  Administered 2024-07-29: 10 mg via INTRAVENOUS
  Administered 2024-07-29: 20 mg via INTRAVENOUS

## 2024-07-29 MED ORDER — PROPOFOL 1000 MG/100ML IV EMUL
INTRAVENOUS | Status: AC
  Filled 2024-07-29: qty 100

## 2024-07-29 MED ORDER — ASPIRIN 81 MG PO CHEW
81.0000 mg | CHEWABLE_TABLET | Freq: Two times a day (BID) | ORAL | Status: DC
  Administered 2024-07-30 – 2024-07-31 (×3): 81 mg via ORAL
  Filled 2024-07-29 (×3): qty 1

## 2024-07-29 MED ORDER — METOCLOPRAMIDE HCL 5 MG/ML IJ SOLN: 5.0000 mg | Freq: Three times a day (TID) | INTRAMUSCULAR | Status: DC | PRN

## 2024-07-29 MED ORDER — CHLORHEXIDINE GLUCONATE 0.12 % MT SOLN
15.0000 mL | Freq: Once | OROMUCOSAL | Status: AC
  Administered 2024-07-29: 15 mL via OROMUCOSAL

## 2024-07-29 MED ORDER — HYDROMORPHONE HCL 1 MG/ML IJ SOLN
INTRAMUSCULAR | Status: DC | PRN
  Administered 2024-07-29: 1 mg via INTRAVENOUS

## 2024-07-29 MED ORDER — LIDOCAINE 2% (20 MG/ML) 5 ML SYRINGE
INTRAMUSCULAR | Status: DC | PRN
  Administered 2024-07-29: 100 mg via INTRAVENOUS

## 2024-07-29 MED ORDER — OXYCODONE HCL 5 MG/5ML PO SOLN: 5.0000 mg | Freq: Once | ORAL | Status: AC | PRN

## 2024-07-29 MED ORDER — ONDANSETRON HCL 4 MG/2ML IJ SOLN: 4.0000 mg | Freq: Four times a day (QID) | INTRAMUSCULAR | Status: DC | PRN

## 2024-07-29 MED ORDER — BISACODYL 10 MG RE SUPP: 10.0000 mg | Freq: Every day | RECTAL | Status: DC | PRN

## 2024-07-29 MED ORDER — POLYETHYLENE GLYCOL 3350 17 G PO PACK: 17.0000 g | PACK | Freq: Every day | ORAL | Status: DC | PRN

## 2024-07-29 MED ORDER — ACETAMINOPHEN 10 MG/ML IV SOLN
1000.0000 mg | Freq: Four times a day (QID) | INTRAVENOUS | Status: DC
  Administered 2024-07-29: 1000 mg via INTRAVENOUS
  Filled 2024-07-29: qty 100

## 2024-07-29 MED ORDER — ALBUTEROL SULFATE HFA 108 (90 BASE) MCG/ACT IN AERS
1.0000 | INHALATION_SPRAY | Freq: Four times a day (QID) | RESPIRATORY_TRACT | Status: DC | PRN
Start: 2024-07-29 — End: 2024-07-29

## 2024-07-29 MED ORDER — ONDANSETRON HCL 4 MG/2ML IJ SOLN
INTRAMUSCULAR | Status: AC
  Filled 2024-07-29: qty 2

## 2024-07-29 MED ORDER — VANCOMYCIN HCL IN DEXTROSE 1-5 GM/200ML-% IV SOLN
1000.0000 mg | INTRAVENOUS | Status: AC
  Administered 2024-07-29: 1000 mg via INTRAVENOUS
  Filled 2024-07-29: qty 200

## 2024-07-29 MED ORDER — ALBUTEROL SULFATE (2.5 MG/3ML) 0.083% IN NEBU
2.5000 mg | INHALATION_SOLUTION | Freq: Four times a day (QID) | RESPIRATORY_TRACT | Status: DC | PRN
  Administered 2024-07-30: 2.5 mg via RESPIRATORY_TRACT
  Filled 2024-07-29: qty 3

## 2024-07-29 MED ORDER — LEVOFLOXACIN IN D5W 500 MG/100ML IV SOLN: 500.0000 mg | INTRAVENOUS | Status: DC

## 2024-07-29 MED ORDER — METHOCARBAMOL 500 MG PO TABS
ORAL_TABLET | ORAL | Status: AC
  Filled 2024-07-29: qty 1

## 2024-07-29 MED ORDER — HYDROMORPHONE HCL 1 MG/ML IJ SOLN
0.2500 mg | INTRAMUSCULAR | Status: DC | PRN
  Administered 2024-07-29 (×4): 0.5 mg via INTRAVENOUS

## 2024-07-29 MED ORDER — 0.9 % SODIUM CHLORIDE (POUR BTL) OPTIME
TOPICAL | Status: DC | PRN
  Administered 2024-07-29: 1000 mL

## 2024-07-29 MED ORDER — METHOCARBAMOL 500 MG PO TABS
500.0000 mg | ORAL_TABLET | Freq: Four times a day (QID) | ORAL | Status: DC | PRN
  Administered 2024-07-29 – 2024-07-31 (×6): 500 mg via ORAL
  Filled 2024-07-29 (×5): qty 1

## 2024-07-29 MED ORDER — OXYCODONE HCL 5 MG PO TABS
ORAL_TABLET | ORAL | Status: AC
  Filled 2024-07-29: qty 1

## 2024-07-29 MED ORDER — DEXAMETHASONE SOD PHOSPHATE PF 10 MG/ML IJ SOLN
10.0000 mg | Freq: Once | INTRAMUSCULAR | Status: AC
  Administered 2024-07-30: 10 mg via INTRAVENOUS

## 2024-07-29 MED ORDER — ONDANSETRON HCL 4 MG PO TABS: 4.0000 mg | ORAL_TABLET | Freq: Four times a day (QID) | ORAL | Status: DC | PRN

## 2024-07-29 MED ORDER — METHOCARBAMOL 1000 MG/10ML IJ SOLN: 500.0000 mg | Freq: Four times a day (QID) | INTRAMUSCULAR | Status: DC | PRN

## 2024-07-29 MED ORDER — FENTANYL CITRATE (PF) 250 MCG/5ML IJ SOLN
INTRAMUSCULAR | Status: DC | PRN
  Administered 2024-07-29 (×2): 50 ug via INTRAVENOUS

## 2024-07-29 MED ORDER — ROCURONIUM 10MG/ML (10ML) SYRINGE FOR MEDFUSION PUMP - OPTIME
INTRAVENOUS | Status: DC | PRN
  Administered 2024-07-29: 70 mg via INTRAVENOUS

## 2024-07-29 MED ORDER — SODIUM CHLORIDE 0.9 % IV SOLN: INTRAVENOUS | Status: DC

## 2024-07-29 MED ORDER — ONDANSETRON HCL 4 MG/2ML IJ SOLN
INTRAMUSCULAR | Status: DC | PRN
  Administered 2024-07-29: 4 mg via INTRAVENOUS

## 2024-07-29 MED ORDER — CEFAZOLIN SODIUM-DEXTROSE 2-4 GM/100ML-% IV SOLN
2.0000 g | Freq: Four times a day (QID) | INTRAVENOUS | Status: AC
  Administered 2024-07-29 – 2024-07-30 (×2): 2 g via INTRAVENOUS
  Filled 2024-07-29 (×2): qty 100

## 2024-07-29 MED ORDER — FENTANYL CITRATE (PF) 100 MCG/2ML IJ SOLN
INTRAMUSCULAR | Status: AC
  Filled 2024-07-29: qty 2

## 2024-07-29 MED ORDER — PROPOFOL 10 MG/ML IV BOLUS
INTRAVENOUS | Status: DC | PRN
  Administered 2024-07-29: 10 mg via INTRAVENOUS
  Administered 2024-07-29: 50 mg via INTRAVENOUS
  Administered 2024-07-29: 20 mg via INTRAVENOUS
  Administered 2024-07-29: 50 mg via INTRAVENOUS
  Administered 2024-07-29: 80 mg via INTRAVENOUS

## 2024-07-29 MED ORDER — DOCUSATE SODIUM 100 MG PO CAPS
100.0000 mg | ORAL_CAPSULE | Freq: Two times a day (BID) | ORAL | Status: DC
  Administered 2024-07-29 – 2024-07-31 (×4): 100 mg via ORAL
  Filled 2024-07-29 (×4): qty 1

## 2024-07-29 MED ORDER — PHENOL 1.4 % MT LIQD: 1.0000 | OROMUCOSAL | Status: DC | PRN

## 2024-07-29 MED ORDER — POVIDONE-IODINE 10 % EX SWAB: 2.0000 | Freq: Once | CUTANEOUS | Status: DC

## 2024-07-29 MED ORDER — ACETAMINOPHEN 500 MG PO TABS: 1000.0000 mg | ORAL_TABLET | Freq: Once | ORAL | Status: AC

## 2024-07-29 MED ORDER — OXYCODONE HCL 5 MG PO TABS
5.0000 mg | ORAL_TABLET | Freq: Once | ORAL | Status: AC | PRN
  Administered 2024-07-29: 5 mg via ORAL

## 2024-07-29 MED ORDER — DEXAMETHASONE SOD PHOSPHATE PF 10 MG/ML IJ SOLN
8.0000 mg | Freq: Once | INTRAMUSCULAR | Status: AC
  Administered 2024-07-29: 10 mg via INTRAVENOUS

## 2024-07-29 MED ORDER — METOCLOPRAMIDE HCL 5 MG PO TABS: 5.0000 mg | ORAL_TABLET | Freq: Three times a day (TID) | ORAL | Status: DC | PRN

## 2024-07-29 MED ORDER — TRANEXAMIC ACID-NACL 1000-0.7 MG/100ML-% IV SOLN
1000.0000 mg | INTRAVENOUS | Status: AC
  Administered 2024-07-29: 1000 mg via INTRAVENOUS
  Filled 2024-07-29: qty 100

## 2024-07-29 MED ORDER — OXYCODONE HCL 5 MG PO TABS
5.0000 mg | ORAL_TABLET | ORAL | Status: DC | PRN
  Administered 2024-07-29: 5 mg via ORAL
  Administered 2024-07-29 – 2024-07-31 (×7): 10 mg via ORAL
  Filled 2024-07-29 (×2): qty 2
  Filled 2024-07-29: qty 1
  Filled 2024-07-29 (×5): qty 2

## 2024-07-29 MED ORDER — HYDROMORPHONE HCL 2 MG/ML IJ SOLN
INTRAMUSCULAR | Status: AC
  Filled 2024-07-29: qty 1

## 2024-07-29 MED ORDER — ORAL CARE MOUTH RINSE: 15.0000 mL | Freq: Once | OROMUCOSAL | Status: AC

## 2024-07-29 MED ORDER — MORPHINE SULFATE (PF) 2 MG/ML IV SOLN: 0.5000 mg | INTRAVENOUS | Status: DC | PRN

## 2024-07-29 MED ORDER — HYDROCODONE-ACETAMINOPHEN 10-325 MG PO TABS
1.0000 | ORAL_TABLET | Freq: Two times a day (BID) | ORAL | Status: DC | PRN
Start: 2024-07-29 — End: 2024-07-31
  Administered 2024-07-30 – 2024-07-31 (×2): 1 via ORAL
  Filled 2024-07-29 (×4): qty 1

## 2024-07-29 MED ORDER — ACETAMINOPHEN 325 MG PO TABS
325.0000 mg | ORAL_TABLET | Freq: Four times a day (QID) | ORAL | Status: DC | PRN
  Administered 2024-07-31: 650 mg via ORAL
  Filled 2024-07-29: qty 2

## 2024-07-29 MED ORDER — BUPIVACAINE-EPINEPHRINE (PF) 0.25% -1:200000 IJ SOLN
INTRAMUSCULAR | Status: AC
  Filled 2024-07-29: qty 30

## 2024-07-29 MED ORDER — ONDANSETRON HCL 4 MG/2ML IJ SOLN: 4.0000 mg | Freq: Once | INTRAMUSCULAR | Status: DC | PRN

## 2024-07-29 MED ORDER — KETAMINE HCL 50 MG/5ML IJ SOSY
PREFILLED_SYRINGE | INTRAMUSCULAR | Status: AC
  Filled 2024-07-29: qty 5

## 2024-07-29 SURGICAL SUPPLY — 35 items
BAG COUNTER SPONGE SURGICOUNT (BAG) IMPLANT
BAG ZIPLOCK 12X15 (MISCELLANEOUS) IMPLANT
BLADE SAG 18X100X1.27 (BLADE) ×1 IMPLANT
COVER PERINEAL POST (MISCELLANEOUS) ×1 IMPLANT
COVER SURGICAL LIGHT HANDLE (MISCELLANEOUS) ×1 IMPLANT
CUP ACETBLR 54 OD PINNACLE (Hips) IMPLANT
DERMABOND ADVANCED .7 DNX12 (GAUZE/BANDAGES/DRESSINGS) ×1 IMPLANT
DRAPE FOOT SWITCH (DRAPES) ×1 IMPLANT
DRAPE STERI IOBAN 125X83 (DRAPES) ×1 IMPLANT
DRAPE U-SHAPE 47X51 STRL (DRAPES) ×2 IMPLANT
DRSG AQUACEL AG ADV 3.5X10 (GAUZE/BANDAGES/DRESSINGS) ×1 IMPLANT
DURAPREP 26ML APPLICATOR (WOUND CARE) ×1 IMPLANT
ELECT REM PT RETURN 15FT ADLT (MISCELLANEOUS) ×1 IMPLANT
GLOVE BIO SURGEON STRL SZ 6.5 (GLOVE) IMPLANT
GLOVE BIO SURGEON STRL SZ7 (GLOVE) IMPLANT
GLOVE BIO SURGEON STRL SZ8 (GLOVE) ×1 IMPLANT
GLOVE BIOGEL PI IND STRL 7.0 (GLOVE) IMPLANT
GLOVE BIOGEL PI IND STRL 8 (GLOVE) ×1 IMPLANT
GOWN STRL REUS W/ TWL LRG LVL3 (GOWN DISPOSABLE) ×2 IMPLANT
HEAD M SROM 36MM PLUS 1.5 (Hips) IMPLANT
HOLDER FOLEY CATH W/STRAP (MISCELLANEOUS) ×1 IMPLANT
KIT TURNOVER KIT A (KITS) ×1 IMPLANT
LINER NEUTRAL 54X36MM PLUS 4 (Hips) IMPLANT
MANIFOLD NEPTUNE II (INSTRUMENTS) ×1 IMPLANT
PACK ANTERIOR HIP CUSTOM (KITS) ×1 IMPLANT
PENCIL SMOKE EVACUATOR COATED (MISCELLANEOUS) ×1 IMPLANT
SPIKE FLUID TRANSFER (MISCELLANEOUS) ×1 IMPLANT
STEM FEMORAL SZ8 STD ACTIS (Stem) IMPLANT
SUT ETHIBOND NAB CT1 #1 30IN (SUTURE) ×1 IMPLANT
SUT MNCRL AB 4-0 PS2 18 (SUTURE) ×1 IMPLANT
SUT VIC AB 2-0 CT1 TAPERPNT 27 (SUTURE) ×2 IMPLANT
SUTURE STRATFX 0 PDS 27 VIOLET (SUTURE) ×1 IMPLANT
TOWEL GREEN STERILE FF (TOWEL DISPOSABLE) ×1 IMPLANT
TRAY FOLEY MTR SLVR 16FR STAT (SET/KITS/TRAYS/PACK) ×1 IMPLANT
TUBE SUCTION HIGH CAP CLEAR NV (SUCTIONS) ×1 IMPLANT

## 2024-07-29 NOTE — Plan of Care (Signed)
   Problem: Activity: Goal: Risk for activity intolerance will decrease Outcome: Progressing   Problem: Pain Managment: Goal: General experience of comfort will improve and/or be controlled Outcome: Progressing   Problem: Safety: Goal: Ability to remain free from injury will improve Outcome: Progressing

## 2024-07-29 NOTE — Plan of Care (Signed)

## 2024-07-29 NOTE — Anesthesia Procedure Notes (Signed)
 Procedure Name: Intubation Date/Time: 07/29/2024 1:21 PM  Performed by: Nanci Riis, CRNAPre-anesthesia Checklist: Emergency Drugs available, Suction available, Patient identified and Patient being monitored Patient Re-evaluated:Patient Re-evaluated prior to induction Oxygen Delivery Method: Circle system utilized Preoxygenation: Pre-oxygenation with 100% oxygen Induction Type: IV induction Ventilation: Mask ventilation without difficulty Laryngoscope Size: Miller and 3 Grade View: Grade I Tube type: Oral Tube size: 7.5 mm Number of attempts: 1 Airway Equipment and Method: Stylet Placement Confirmation: ETT inserted through vocal cords under direct vision, positive ETCO2 and breath sounds checked- equal and bilateral Secured at: 22 cm Tube secured with: Tape Dental Injury: Teeth and Oropharynx as per pre-operative assessment

## 2024-07-29 NOTE — Evaluation (Signed)
 Physical Therapy Evaluation Patient Details Name: Evan Price MRN: [id number] DOB: Dec 23, 1945 Today's Date: 07/29/2024  History of Present Illness  78 yo male presents to therapy s/p L THA, anterior approach on 07/29/2024 due to failure of conservative measures. Pt PMH includes but is not limited to: anemia, CKD II, hypotension, asthma, HLD, chronic back pain, and GERD.  Clinical Impression    Evan Price is a 78 y.o. male POD 0 s/p L THA. Patient reports IND with mobility at baseline. Patient is now limited by functional impairments (see PT problem list below) and requires CGA for supine to sit and mod A for B LE for sit to supine for bed mobility and CGA for transfers. Patient was able to side step ~3 feet with RW and min A level of assist, pt limited to due to pain with medication administered 44 mins prior to PT arrival. Patient instructed in exercise to facilitate ROM and circulation to manage edema. Patient will benefit from continued skilled PT interventions to address impairments and progress towards PLOF. Acute PT will follow to progress mobility and stair training in preparation for safe discharge home with family support and HEP.       If plan is discharge home, recommend the following: A little help with walking and/or transfers;A little help with bathing/dressing/bathroom;Assistance with cooking/housework;Assist for transportation   Can travel by private vehicle        Equipment Recommendations Rolling walker (2 wheels)  Recommendations for Other Services       Functional Status Assessment Patient has had a recent decline in their functional status and demonstrates the ability to make significant improvements in function in a reasonable and predictable amount of time.     Precautions / Restrictions Precautions Precautions: Fall Restrictions Weight Bearing Restrictions Per Provider Order: No      Mobility  Bed Mobility Overal bed mobility: Needs Assistance Bed  Mobility: Sit to Supine, Supine to Sit     Supine to sit: Contact guard, HOB elevated, Used rails Sit to supine: Mod assist   General bed mobility comments: increased time, CGA and cues for supine to sit with use of hospital bed, mod A for B LE to return to flat bed    Transfers Overall transfer level: Needs assistance Equipment used: Rolling walker (2 wheels) Transfers: Sit to/from Stand Sit to Stand: Contact guard assist, From elevated surface           General transfer comment: min cues and pt pull to stand    Ambulation/Gait Ambulation/Gait assistance: Min assist Gait Distance (Feet): 3 Feet Assistive device: Rolling walker (2 wheels) Gait Pattern/deviations: Step-to pattern Gait velocity: decreased     General Gait Details: pt limited with gait due to pain increasing with L LE WB and able to tolerance side stepping to the L ~ 3 feet with min A and cues  Stairs            Wheelchair Mobility     Tilt Bed    Modified Rankin (Stroke Patients Only)       Balance Overall balance assessment: Needs assistance Sitting-balance support: Feet supported Sitting balance-Leahy Scale: Good     Standing balance support: Bilateral upper extremity supported, During functional activity, Reliant on assistive device for balance Standing balance-Leahy Scale: Poor                               Pertinent Vitals/Pain Pain Assessment Pain Assessment: 0-10 Pain  Score: 8  Pain Location: L LE and hip Pain Descriptors / Indicators: Aching, Constant, Discomfort, Dull, Grimacing, Operative site guarding Pain Intervention(s): Monitored during session, Limited activity within patient's tolerance, Premedicated before session, Repositioned, Ice applied    Home Living Family/patient expects to be discharged to:: Private residence Living Arrangements: Children Available Help at Discharge: Family Type of Home: House Home Access: Ramped entrance       Home  Layout: One level Home Equipment: Standard Walker;Cane - single point      Prior Function Prior Level of Function : Independent/Modified Independent;Driving;Working/employed             Mobility Comments: IND no AD for all ADLs, self care tasks and IADLs       Extremity/Trunk Assessment        Lower Extremity Assessment Lower Extremity Assessment: LLE deficits/detail LLE Deficits / Details: ankle DF/PF 5/5 LLE Sensation: WNL    Cervical / Trunk Assessment Cervical / Trunk Assessment: Normal  Communication   Communication Communication: No apparent difficulties    Cognition Arousal: Lethargic Behavior During Therapy: WFL for tasks assessed/performed   PT - Cognitive impairments: No apparent impairments                         Following commands: Intact       Cueing       General Comments      Exercises Total Joint Exercises Ankle Circles/Pumps: AROM, Both, 5 reps   Assessment/Plan    PT Assessment Patient needs continued PT services  PT Problem List Decreased strength;Decreased range of motion;Decreased activity tolerance;Decreased balance;Decreased mobility;Decreased coordination;Pain       PT Treatment Interventions DME instruction;Gait training;Functional mobility training;Therapeutic activities;Therapeutic exercise;Balance training;Neuromuscular re-education;Patient/family education;Modalities    PT Goals (Current goals can be found in the Care Plan section)  Acute Rehab PT Goals Patient Stated Goal: to get back to work PT Goal Formulation: With patient Time For Goal Achievement: 08/12/24 Potential to Achieve Goals: Good    Frequency 7X/week     Co-evaluation               AM-PAC PT 6 Clicks Mobility  Outcome Measure Help needed turning from your back to your side while in a flat bed without using bedrails?: A Little Help needed moving from lying on your back to sitting on the side of a flat bed without using bedrails?:  A Little Help needed moving to and from a bed to a chair (including a wheelchair)?: A Little Help needed standing up from a chair using your arms (e.g., wheelchair or bedside chair)?: A Little Help needed to walk in hospital room?: Total Help needed climbing 3-5 steps with a railing? : Total 6 Click Score: 14    End of Session Equipment Utilized During Treatment: Gait belt Activity Tolerance: Patient limited by pain;Patient limited by fatigue Patient left: in bed;with call bell/phone within reach;with family/visitor present Nurse Communication: Mobility status PT Visit Diagnosis: Unsteadiness on feet (R26.81);Other abnormalities of gait and mobility (R26.89);Muscle weakness (generalized) (M62.81);Difficulty in walking, not elsewhere classified (R26.2);Pain Pain - Right/Left: Left Pain - part of body: Hip;Leg    Time: 8252-8196 PT Time Calculation (min) (ACUTE ONLY): 16 min   Charges:   PT Evaluation $PT Eval Low Complexity: 1 Low   PT General Charges $$ ACUTE PT VISIT: 1 Visit         [place], PT Acute Rehab   [place] VEAR Drone 07/29/2024, 6:22 PM

## 2024-07-29 NOTE — Transfer of Care (Signed)
 Immediate Anesthesia Transfer of Care Note  Patient: Evan Price  Procedure(s) Performed: ARTHROPLASTY, HIP, TOTAL, ANTERIOR APPROACH (Left: Hip)  Patient Location: PACU  Anesthesia Type:General  Level of Consciousness: drowsy and patient cooperative  Airway & Oxygen Therapy: Patient Spontanous Breathing  Post-op Assessment: Report given to RN and Post -op Vital signs reviewed and stable  Post vital signs: Reviewed and stable  Last Vitals:  Vitals Value Taken Time  BP 157/86 07/29/24 14:45  Temp    Pulse 95 07/29/24 14:46  Resp 16 07/29/24 14:47  SpO2 100 % 07/29/24 14:46  Vitals shown include unfiled device data.  Last Pain:  Vitals:   07/29/24 1114  TempSrc:   PainSc: 6          Complications: No notable events documented.

## 2024-07-29 NOTE — Discharge Instructions (Signed)
Evan Aluisio, MD Total Joint Specialist [facility] [place] [address] [place], [place] [address] [phone]  ANTERIOR APPROACH TOTAL HIP REPLACEMENT POSTOPERATIVE DIRECTIONS     Hip Rehabilitation, Guidelines Following Surgery  The results of a hip operation are greatly improved after range of motion and muscle strengthening exercises. Follow all safety measures which are given to protect your hip. If any of these exercises cause increased pain or swelling in your joint, decrease the amount until you are comfortable again. Then slowly increase the exercises. Call your caregiver if you have problems or questions.   BLOOD CLOT PREVENTION Take an 81 mg Aspirin two times a day for three weeks following surgery. Then take an 81 mg Aspirin once a day for three weeks. Then discontinue Aspirin. You may resume your vitamins/supplements upon discharge from the hospital. Do not take any NSAIDs (Advil, Aleve, Ibuprofen, Meloxicam, etc.) until you are 3 weeks out from surgery  HOME CARE INSTRUCTIONS  Remove items at home which could result in a fall. This includes throw rugs or furniture in walking pathways.  ICE to the affected hip as frequently as 20-30 minutes an hour and then as needed for pain and swelling. Continue to use ice on the hip for pain and swelling from surgery. You may notice swelling that will progress down to the foot and ankle. This is normal after surgery. Elevate the leg when you are not up walking on it.   Continue to use the breathing machine which will help keep your temperature down.  It is common for your temperature to cycle up and down following surgery, especially at night when you are not up moving around and exerting yourself.  The breathing machine keeps your lungs expanded and your temperature down.  DIET You may resume your previous home diet once your are discharged from the hospital.  DRESSING / WOUND CARE / SHOWERING You have an  adhesive waterproof bandage over the incision. Leave this in place until your first follow-up appointment. Once you remove this you will not need to place another bandage.  You may begin showering 3 days following surgery, but do not submerge the incision under water.  ACTIVITY For the first 3-5 days, it is important to rest and keep the operative leg elevated. You should, as a general rule, rest for 50 minutes and walk/stretch for 10 minutes per hour. After 5 days, you may slowly increase activity as tolerated.  Perform the exercises you were provided twice a day for about 15-20 minutes each session. Begin these 2 days following surgery. Walk with your walker as instructed. Use the walker until you are comfortable transitioning to a cane. Walk with the cane in the opposite hand of the operative leg. You may discontinue the cane once you are comfortable and walking steadily. Avoid periods of inactivity such as sitting longer than an hour when not asleep. This helps prevent blood clots.  Do not drive a car for 6 weeks or until released by your surgeon.  Do not drive while taking narcotics.  TED HOSE STOCKINGS Wear the elastic stockings on both legs for three weeks following surgery during the day. You may remove them at night while sleeping.  WEIGHT BEARING Weight bearing as tolerated with assist device (walker, cane, etc) as directed, use it as long as suggested by your surgeon or therapist, typically at least 4-6 weeks.  POSTOPERATIVE CONSTIPATION PROTOCOL Constipation - defined medically as fewer than three stools per week and severe constipation as   less than one stool per week.  One of the most common issues patients have following surgery is constipation.  Even if you have a regular bowel pattern at home, your normal regimen is likely to be disrupted due to multiple reasons following surgery.  Combination of anesthesia, postoperative narcotics, change in appetite and fluid intake all can  affect your bowels.  In order to avoid complications following surgery, here are some recommendations in order to help you during your recovery period.  Colace (docusate) - Pick up an over-the-counter form of Colace or another stool softener and take twice a day as long as you are requiring postoperative pain medications.  Take with a full glass of water daily.  If you experience loose stools or diarrhea, hold the colace until you stool forms back up.  If your symptoms do not get better within 1 week or if they get worse, check with your doctor. Dulcolax (bisacodyl) - Pick up over-the-counter and take as directed by the product packaging as needed to assist with the movement of your bowels.  Take with a full glass of water.  Use this product as needed if not relieved by Colace only.  MiraLax (polyethylene glycol) - Pick up over-the-counter to have on hand.  MiraLax is a solution that will increase the amount of water in your bowels to assist with bowel movements.  Take as directed and can mix with a glass of water, juice, soda, coffee, or tea.  Take if you go more than two days without a movement.Do not use MiraLax more than once per day. Call your doctor if you are still constipated or irregular after using this medication for 7 days in a row.  If you continue to have problems with postoperative constipation, please contact the office for further assistance and recommendations.  If you experience "the worst abdominal pain ever" or develop nausea or vomiting, please contact the office immediatly for further recommendations for treatment.  ITCHING  If you experience itching with your medications, try taking only a single pain pill, or even half a pain pill at a time.  You can also use Benadryl over the counter for itching or also to help with sleep.   MEDICATIONS See your medication summary on the "After Visit Summary" that the nursing staff will review with you prior to discharge.  You may have some home  medications which will be placed on hold until you complete the course of blood thinner medication.  It is important for you to complete the blood thinner medication as prescribed by your surgeon.  Continue your approved medications as instructed at time of discharge.  PRECAUTIONS If you experience chest pain or shortness of breath - call 911 immediately for transfer to the hospital emergency department.  If you develop a fever greater that 101 F, purulent drainage from wound, increased redness or drainage from wound, foul odor from the wound/dressing, or calf pain - CONTACT YOUR SURGEON.                                                   FOLLOW-UP APPOINTMENTS Make sure you keep all of your appointments after your operation with your surgeon and caregivers. You should call the office at the above phone number and make an appointment for approximately two weeks after the date of your surgery or on the   date instructed by your surgeon outlined in the "After Visit Summary".  RANGE OF MOTION AND STRENGTHENING EXERCISES  These exercises are designed to help you keep full movement of your hip joint. Follow your caregiver's or physical therapist's instructions. Perform all exercises about fifteen times, three times per day or as directed. Exercise both hips, even if you have had only one joint replacement. These exercises can be done on a training (exercise) mat, on the floor, on a table or on a bed. Use whatever works the best and is most comfortable for you. Use music or television while you are exercising so that the exercises are a pleasant break in your day. This will make your life better with the exercises acting as a break in routine you can look forward to.  Lying on your back, slowly slide your foot toward your buttocks, raising your knee up off the floor. Then slowly slide your foot back down until your leg is straight again.  Lying on your back spread your legs as far apart as you can without causing  discomfort.  Lying on your side, raise your upper leg and foot straight up from the floor as far as is comfortable. Slowly lower the leg and repeat.  Lying on your back, tighten up the muscle in the front of your thigh (quadriceps muscles). You can do this by keeping your leg straight and trying to raise your heel off the floor. This helps strengthen the largest muscle supporting your knee.  Lying on your back, tighten up the muscles of your buttocks both with the legs straight and with the knee bent at a comfortable angle while keeping your heel on the floor.   POST-OPERATIVE OPIOID TAPER INSTRUCTIONS: It is important to wean off of your opioid medication as soon as possible. If you do not need pain medication after your surgery it is ok to stop day one. Opioids include: Codeine, Hydrocodone(Norco, Vicodin), Oxycodone(Percocet, oxycontin) and hydromorphone amongst others.  Long term and even short term use of opiods can cause: Increased pain response Dependence Constipation Depression Respiratory depression And more.  Withdrawal symptoms can include Flu like symptoms Nausea, vomiting And more Techniques to manage these symptoms Hydrate well Eat regular healthy meals Stay active Use relaxation techniques(deep breathing, meditating, yoga) Do Not substitute Alcohol to help with tapering If you have been on opioids for less than two weeks and do not have pain than it is ok to stop all together.  Plan to wean off of opioids This plan should start within one week post op of your joint replacement. Maintain the same interval or time between taking each dose and first decrease the dose.  Cut the total daily intake of opioids by one tablet each day Next start to increase the time between doses. The last dose that should be eliminated is the evening dose.   IF YOU ARE TRANSFERRED TO A SKILLED REHAB FACILITY If the patient is transferred to a skilled rehab facility following release from the  hospital, a list of the current medications will be sent to the facility for the patient to continue.  When discharged from the skilled rehab facility, please have the facility set up the patient's Home Health Physical Therapy prior to being released. Also, the skilled facility will be responsible for providing the patient with their medications at time of release from the facility to include their pain medication, the muscle relaxants, and their blood thinner medication. If the patient is still at the rehab facility   at time of the two week follow up appointment, the skilled rehab facility will also need to assist the patient in arranging follow up appointment in our office and any transportation needs.  MAKE SURE YOU:  Understand these instructions.  Get help right away if you are not doing well or get worse.    DENTAL ANTIBIOTICS:  In most cases prophylactic antibiotics for Dental procdeures after total joint surgery are not necessary.  Exceptions are as follows:  1. History of prior total joint infection  2. Severely immunocompromised (Organ Transplant, cancer chemotherapy, Rheumatoid biologic meds such as Humera)  3. Poorly controlled diabetes (A1C &gt; 8.0, blood glucose over 200)  If you have one of these conditions, contact your surgeon for an antibiotic prescription, prior to your dental procedure.    Pick up stool softner and laxative for home use following surgery while on pain medications. Do not submerge incision under water. Please use good hand washing techniques while changing dressing each day. May shower starting three days after surgery. Please use a clean towel to pat the incision dry following showers. Continue to use ice for pain and swelling after surgery. Do not use any lotions or creams on the incision until instructed by your surgeon.  

## 2024-07-29 NOTE — Progress Notes (Signed)
 Pre Duoneb   07/29/24 1226  Vitals  Resp 16  Respiratory Pattern Regular;Unlabored  SpO2 99 %  Oxygen Therapy  O2 Device Room Air   Post Duoneb   Resp - 16  Resp pattern - Reg, unlabored Sp02 - 100% on RA

## 2024-07-29 NOTE — Op Note (Signed)
 OPERATIVE REPORT- TOTAL HIP ARTHROPLASTY   PREOPERATIVE DIAGNOSIS: Osteoarthritis of the Left hip.   POSTOPERATIVE DIAGNOSIS: Osteoarthritis of the Left  hip.   PROCEDURE: Left total hip arthroplasty, anterior approach.   SURGEON: Dempsey Moan, MD   ASSISTANT: Roxie Mess, PA-C  ANESTHESIA:  General  ESTIMATED BLOOD LOSS:-350 mL    DRAINS: None  COMPLICATIONS: None   CONDITION: PACU - hemodynamically stable.   BRIEF CLINICAL NOTE: Evan Price is a 78 y.o. male who has advanced end-  stage arthritis of their Left  hip with progressively worsening pain and  dysfunction.The patient has failed nonoperative management and presents for  total hip arthroplasty.   PROCEDURE IN DETAIL: After successful administration of spinal  anesthetic, the traction boots for the [facility] bed were placed on both  feet and the patient was placed onto the [facility] bed, boots placed into the leg  holders. The Left hip was then isolated from the perineum with plastic  drapes and prepped and draped in the usual sterile fashion. ASIS and  greater trochanter were marked and a oblique incision was made, starting  at about 1 cm lateral and 2 cm distal to the ASIS and coursing towards  the anterior cortex of the femur. The skin was cut with a 10 blade  through subcutaneous tissue to the level of the fascia overlying the  tensor fascia lata muscle. The fascia was then incised in line with the  incision at the junction of the anterior third and posterior 2/3rd. The  muscle was teased off the fascia and then the interval between the TFL  and the rectus was developed. The Hohmann retractor was then placed at  the top of the femoral neck over the capsule. The vessels overlying the  capsule were cauterized and the fat on top of the capsule was removed.  A Hohmann retractor was then placed anterior underneath the rectus  femoris to give exposure to the entire anterior capsule. A T-shaped  capsulotomy was  performed. The edges were tagged and the femoral head  was identified.       Osteophytes are removed off the superior acetabulum.  The femoral neck was then cut in situ with an oscillating saw. Traction  was then applied to the left lower extremity utilizing the [facility]  traction. The femoral head was then removed. Retractors were placed  around the acetabulum and then circumferential removal of the labrum was  performed. Osteophytes were also removed. Reaming starts at 51 mm to  medialize and  Increased in 2 mm increments to 53 mm. We reamed in  approximately 40 degrees of abduction, 20 degrees anteversion. A 54 mm  pinnacle acetabular shell was then impacted in anatomic position under  fluoroscopic guidance with excellent purchase. We did not need to place  any additional dome screws. A 36 mm neutral + 4 Altrx liner was then  placed into the acetabular shell.       The femoral lift was then placed along the lateral aspect of the femur  just distal to the vastus ridge. The leg was  externally rotated and capsule  was stripped off the inferior aspect of the femoral neck down to the  level of the lesser trochanter, this was done with electrocautery. The femur was lifted after this was performed. The  leg was then placed in an extended and adducted position essentially delivering the femur. We also removed the capsule superiorly and the piriformis from the piriformis fossa to gain  excellent exposure of the  proximal femur. Rongeur was used to remove some cancellous bone to get  into the lateral portion of the proximal femur for placement of the  initial starter reamer. The starter broaches was placed  the starter broach  and was shown to go down the center of the canal. Broaching  with the Actis system was then performed starting at size 0  coursing  Up to size 8. A size 8 had excellent torsional and rotational  and axial stability. The trial standard offset neck was then placed  with a 36 + 1.5  trial head. The hip was then reduced. We confirmed that  the stem was in the canal both on AP and lateral x-rays. It also has excellent sizing. The hip was reduced with outstanding stability through full extension and full external rotation.. AP pelvis was taken and the leg lengths were measured and found to be equal. Hip was then dislocated again and the femoral head and neck removed. The  femoral broach was removed. Size 8 Actis stem with a standard offset  neck was then impacted into the femur following native anteversion. Has  excellent purchase in the canal. Excellent torsional and rotational and  axial stability. It is confirmed to be in the canal on AP and lateral  fluoroscopic views. The 36 + 1.5 metal head was placed and the hip  reduced with outstanding stability. Again AP pelvis was taken and it  confirmed that the leg lengths were equal. The wound was then copiously  irrigated with saline solution and the capsule reattached and repaired  with Ethibond suture. 30 ml of .25% Bupivicaine was  injected into the capsule and into the edge of the tensor fascia lata as well as subcutaneous tissue. The fascia overlying the tensor fascia lata was then closed with a running #1 V-Loc. Subcu was closed with interrupted 2-0 Vicryl and subcuticular running 4-0 Monocryl. Incision was cleaned  and dried. Steri-Strips and a bulky sterile dressing applied. The patient was awakened and transported to  recovery in stable condition.        Please note that a surgical assistant was a medical necessity for this procedure to perform it in a safe and expeditious manner. Assistant was necessary to provide appropriate retraction of vital neurovascular structures and to prevent femoral fracture and allow for anatomic placement of the prosthesis.  Dempsey Moan, M.D.

## 2024-07-29 NOTE — Interval H&P Note (Signed)
 History and Physical Interval Note:  07/29/2024 11:54 AM  Evan Price  has presented today for surgery, with the diagnosis of Left hip osteoarthritis.  The various methods of treatment have been discussed with the patient and family. After consideration of risks, benefits and other options for treatment, the patient has consented to  Procedure(s): ARTHROPLASTY, HIP, TOTAL, ANTERIOR APPROACH (Left) as a surgical intervention.  The patient's history has been reviewed, patient examined, no change in status, stable for surgery.  I have reviewed the patient's chart and labs.  Questions were answered to the patient's satisfaction.     Dempsey Ima Hafner

## 2024-07-30 ENCOUNTER — Encounter (HOSPITAL_COMMUNITY): Payer: Self-pay | Admitting: Orthopedic Surgery

## 2024-07-30 ENCOUNTER — Other Ambulatory Visit (HOSPITAL_COMMUNITY): Payer: Self-pay

## 2024-07-30 DIAGNOSIS — M1612 Unilateral primary osteoarthritis, left hip: Secondary | ICD-10-CM | POA: Diagnosis not present

## 2024-07-30 LAB — BASIC METABOLIC PANEL WITH GFR
Anion gap: 8 (ref 5–15)
BUN: 15 mg/dL (ref 8–23)
CO2: 26 mmol/L (ref 22–32)
Calcium: 9.1 mg/dL (ref 8.9–10.3)
Chloride: 104 mmol/L (ref 98–111)
Creatinine, Ser: 1.21 mg/dL (ref 0.61–1.24)
GFR, Estimated: >60 mL/min (ref >60)
Glucose, Bld: 151 mg/dL — ABNORMAL HIGH (ref 70–99)
Potassium: 5 mmol/L (ref 3.5–5.1)
Sodium: 138 mmol/L (ref 135–145)

## 2024-07-30 LAB — CBC
HCT: 35.7 % — ABNORMAL LOW (ref 39.0–52.0)
Hemoglobin: 10.9 g/dL — ABNORMAL LOW (ref 13.0–17.0)
MCH: 28.2 pg (ref 26.0–34.0)
MCHC: 30.5 g/dL (ref 30.0–36.0)
MCV: 92.5 fL (ref 80.0–100.0)
Platelets: 233 K/uL (ref 150–400)
RBC: 3.86 MIL/uL — ABNORMAL LOW (ref 4.22–5.81)
RDW: 14.9 % (ref 11.5–15.5)
WBC: 16.9 K/uL — ABNORMAL HIGH (ref 4.0–10.5)
nRBC: 0 % (ref 0.0–0.2)

## 2024-07-30 MED ORDER — METHOCARBAMOL 500 MG PO TABS
500.0000 mg | ORAL_TABLET | Freq: Four times a day (QID) | ORAL | 0 refills | Status: DC | PRN
  Filled 2024-07-30: qty 40, 10d supply, fill #0

## 2024-07-30 MED ORDER — ALUM & MAG HYDROXIDE-SIMETH 200-200-20 MG/5ML PO SUSP
30.0000 mL | ORAL | Status: DC | PRN
  Administered 2024-07-30 – 2024-07-31 (×2): 30 mL via ORAL
  Filled 2024-07-30 (×2): qty 30

## 2024-07-30 MED ORDER — ONDANSETRON HCL 4 MG PO TABS
4.0000 mg | ORAL_TABLET | Freq: Four times a day (QID) | ORAL | 0 refills | Status: DC | PRN
  Filled 2024-07-30: qty 20, 5d supply, fill #0

## 2024-07-30 MED ORDER — OXYCODONE HCL 5 MG PO TABS
5.0000 mg | ORAL_TABLET | ORAL | 0 refills | Status: DC | PRN
  Filled 2024-07-30: qty 30, 5d supply, fill #0

## 2024-07-30 MED ORDER — ASPIRIN 81 MG PO CHEW
81.0000 mg | CHEWABLE_TABLET | Freq: Two times a day (BID) | ORAL | 0 refills | Status: DC
  Filled 2024-07-30: qty 63, 32d supply, fill #0

## 2024-07-30 NOTE — Progress Notes (Addendum)
 Physical Therapy Treatment Patient Details Name: Evan Price MRN: [id number] DOB: 22-Jul-1946 Today's Date: 07/30/2024   History of Present Illness 79 yo male presents to therapy s/p L THA, anterior approach on 07/29/2024 due to failure of conservative measures. Pt PMH includes but is not limited to: anemia, CKD II, hypotension, asthma, HLD, chronic back pain, and GERD.    PT Comments  Pt ambulated 12' with RW, distance limited by pain and fatigue. Reviewed HEP. Pt is not yet ready to DC home from a PT standpoint.    If plan is discharge home, recommend the following: A little help with walking and/or transfers;A little help with bathing/dressing/bathroom;Assistance with cooking/housework;Assist for transportation   Can travel by private vehicle        Equipment Recommendations  Rolling walker (2 wheels)    Recommendations for Other Services       Precautions / Restrictions Precautions Precautions: Fall Recall of Precautions/Restrictions: Intact Restrictions Weight Bearing Restrictions Per Provider Order: No     Mobility  Bed Mobility Overal bed mobility: Modified Independent Bed Mobility: Supine to Sit     Supine to sit: Used rails, HOB elevated, Supervision     General bed mobility comments: assist to raise trunk and pivot hips to EOB, gait belt used as LLE lifter    Transfers Overall transfer level: Needs assistance Equipment used: Rolling walker (2 wheels) Transfers: Sit to/from Stand Sit to Stand: From elevated surface, Contact guard assist           General transfer comment: VCs hand placement    Ambulation/Gait Ambulation/Gait assistance: Contact guard assist Gait Distance (Feet): 12 Feet Assistive device: Rolling walker (2 wheels) Gait Pattern/deviations: Step-to pattern, Decreased step length - right, Decreased step length - left Gait velocity: decreased     General Gait Details: VCs sequencing, distance limited by pain and fatigue   Stairs              Wheelchair Mobility     Tilt Bed    Modified Rankin (Stroke Patients Only)       Balance Overall balance assessment: Needs assistance Sitting-balance support: Feet supported Sitting balance-Leahy Scale: Good     Standing balance support: Bilateral upper extremity supported, During functional activity, Reliant on assistive device for balance Standing balance-Leahy Scale: Poor                              Communication Communication Communication: No apparent difficulties Factors Affecting Communication: Reduced clarity of speech  Cognition Arousal: Alert Behavior During Therapy: WFL for tasks assessed/performed   PT - Cognitive impairments: No apparent impairments                                Cueing    Exercises Total Joint Exercises Ankle Circles/Pumps: AROM, Both, 10 reps, Supine Quad Sets: AROM, Both, 5 reps, Supine Short Arc Quad: AROM, Left, 5 reps, Supine Heel Slides: AAROM, Left, Supine, 10 reps Hip ABduction/ADduction: AAROM, Left, 5 reps, Supine    General Comments        Pertinent Vitals/Pain Pain Assessment Pain Score: 7  Pain Location: L  hip Pain Descriptors / Indicators: Aching, Constant, Discomfort, Dull, Grimacing, Operative site guarding Pain Intervention(s): Limited activity within patient's tolerance, Monitored during session, Premedicated before session, Ice applied    Home Living  Prior Function            PT Goals (current goals can now be found in the care plan section) Acute Rehab 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 PT Goal Formulation: With patient/family Time For Goal Achievement: 08/12/24 Potential to Achieve Goals: Good Progress towards PT goals: Progressing toward goals    Frequency    7X/week      PT Plan      Co-evaluation              AM-PAC PT 6 Clicks Mobility   Outcome Measure  Help needed turning from your  back to your side while in a flat bed without using bedrails?: A Little Help needed moving from lying on your back to sitting on the side of a flat bed without using bedrails?: A Little Help needed moving to and from a bed to a chair (including a wheelchair)?: A Little Help needed standing up from a chair using your arms (e.g., wheelchair or bedside chair)?: A Little Help needed to walk in hospital room?: A Little Help needed climbing 3-5 steps with a railing? : A Lot 6 Click Score: 17    End of Session Equipment Utilized During Treatment: Gait belt Activity Tolerance: Patient limited by pain;Patient limited by fatigue Patient left: with call bell/phone within reach;with family/visitor present;in chair Nurse Communication: Mobility status PT Visit Diagnosis: Unsteadiness on feet (R26.81);Other abnormalities of gait and mobility (R26.89);Muscle weakness (generalized) (M62.81);Difficulty in walking, not elsewhere classified (R26.2);Pain Pain - Right/Left: Left Pain - part of body: Hip     Time: 8690-8668 PT Time Calculation (min) (ACUTE ONLY): 22 min  Charges:    $Gait Training: 8-22 mins  PT General Charges $$ ACUTE PT VISIT: 1 Visit                    Evan Price PT 07/30/2024  Acute Rehabilitation Services  Office [phone]

## 2024-07-30 NOTE — Progress Notes (Signed)
 Physical Therapy Treatment Patient Details Name: Evan Price MRN: [id number] DOB: 1945-11-14 Today's Date: 07/30/2024   History of Present Illness 78 yo male presents to therapy s/p L THA, anterior approach on 07/29/2024 due to failure of conservative measures. Pt PMH includes but is not limited to: anemia, CKD II, hypotension, asthma, HLD, chronic back pain, and GERD.    PT Comments  Instructed pt/son in THA HEP. Pt then ambulated 6' with RW, distance limited by pain and lightheadedness. Assisted pt to recliner where BP was 145/61,  HR 92, SpO2 97% on room air. Will plan to see pt for a second visit this afternoon. He is not yet ready to DC home from a PT standpoint.   If plan is discharge home, recommend the following: A little help with walking and/or transfers;A little help with bathing/dressing/bathroom;Assistance with cooking/housework;Assist for transportation   Can travel by private vehicle        Equipment Recommendations  Rolling walker (2 wheels)    Recommendations for Other Services       Precautions / Restrictions Precautions Precautions: Fall Recall of Precautions/Restrictions: Intact Restrictions Weight Bearing Restrictions Per Provider Order: No     Mobility  Bed Mobility Overal bed mobility: Needs Assistance Bed Mobility: Supine to Sit     Supine to sit: Mod assist, HOB elevated, Used rails     General bed mobility comments: assist to raise trunk and pivot hips to EOB, gait belt used as LLE lifter    Transfers Overall transfer level: Needs assistance Equipment used: Rolling walker (2 wheels) Transfers: Sit to/from Stand Sit to Stand: From elevated surface, Min assist           General transfer comment: VCs hand placement, min A to power up    Ambulation/Gait Ambulation/Gait assistance: Contact guard assist Gait Distance (Feet): 6 Feet Assistive device: Rolling walker (2 wheels) Gait Pattern/deviations: Step-to pattern, Decreased step length  - right, Decreased step length - left Gait velocity: decreased     General Gait Details: VCs sequencing, distance limited by pain and lightheadedness. Assisted pt to recliner where BP was 145/61, HR 92, SpO2 97% on room air   Stairs             Wheelchair Mobility     Tilt Bed    Modified Rankin (Stroke Patients Only)       Balance Overall balance assessment: Needs assistance Sitting-balance support: Feet supported Sitting balance-Leahy Scale: Good     Standing balance support: Bilateral upper extremity supported, During functional activity, Reliant on assistive device for balance Standing balance-Leahy Scale: Poor                              Communication Communication Communication: Impaired Factors Affecting Communication: Reduced clarity of speech  Cognition Arousal: Alert Behavior During Therapy: WFL for tasks assessed/performed   PT - Cognitive impairments: No apparent impairments                                Cueing    Exercises Total Joint Exercises Ankle Circles/Pumps: AROM, Both, 10 reps, Supine Quad Sets: AROM, Both, 5 reps, Supine Short Arc Quad: AROM, Left, 5 reps, Supine Heel Slides: AAROM, Left, 5 reps, Supine Hip ABduction/ADduction: AAROM, Left, 5 reps, Supine    General Comments        Pertinent Vitals/Pain Pain Assessment Pain Score: 7  Pain Location:  L LE and hip Pain Descriptors / Indicators: Aching, Constant, Discomfort, Dull, Grimacing, Operative site guarding Pain Intervention(s): Limited activity within patient's tolerance, Monitored during session, Premedicated before session, Ice applied, Repositioned    Home Living                          Prior Function            PT Goals (current goals can now be found in the care plan section) Acute Rehab 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 PT Goal Formulation: With patient/family Time For Goal Achievement: 08/12/24 Potential to  Achieve Goals: Good Progress towards PT goals: Progressing toward goals    Frequency    7X/week      PT Plan      Co-evaluation              AM-PAC PT 6 Clicks Mobility   Outcome Measure  Help needed turning from your back to your side while in a flat bed without using bedrails?: A Little Help needed moving from lying on your back to sitting on the side of a flat bed without using bedrails?: A Little Help needed moving to and from a bed to a chair (including a wheelchair)?: A Little Help needed standing up from a chair using your arms (e.g., wheelchair or bedside chair)?: A Little Help needed to walk in hospital room?: A Little Help needed climbing 3-5 steps with a railing? : A Lot 6 Click Score: 17    End of Session Equipment Utilized During Treatment: Gait belt Activity Tolerance: Patient limited by pain;Patient limited by fatigue Patient left: with call bell/phone within reach;with family/visitor present;in chair Nurse Communication: Mobility status PT Visit Diagnosis: Unsteadiness on feet (R26.81);Other abnormalities of gait and mobility (R26.89);Muscle weakness (generalized) (M62.81);Difficulty in walking, not elsewhere classified (R26.2);Pain Pain - Right/Left: Left Pain - part of body: Hip     Time: 9056-8987 PT Time Calculation (min) (ACUTE ONLY): 29 min  Charges:    $Gait Training: 8-22 mins $Therapeutic Exercise: 8-22 mins PT General Charges $$ ACUTE PT VISIT: 1 Visit                     Sylvan Delon Copp PT 07/30/2024  Acute Rehabilitation Services  Office [phone]

## 2024-07-30 NOTE — TOC Transition Note (Signed)
 Transition of Care [facility]) - Discharge Note   Patient Details  Name: Evan Price MRN: [id number] Date of Birth: 10/07/1945  Transition of Care [facility] CM/SW Contact:  NORMAN ASPEN, LCSW Phone Number: 07/30/2024, 9:47 AM   Clinical Narrative:     Met with pt and son who confirm need for RW and no DME agency preference - order placed with [employer] and item delivered to room.  Plan for HEP.  No further IP CM needs.  Final next level of care: Home/Self Care Barriers to Discharge: No Barriers Identified   Patient Goals and CMS Choice Patient states their goals for this hospitalization and ongoing recovery are:: return home          Discharge Placement                       Discharge Plan and Services Additional resources added to the After Visit Summary for                  DME Arranged: Walker rolling DME Agency: [employer] Date DME Agency Contacted: 07/30/24 Time DME Agency Contacted: 0930 Representative spoke with at DME Agency: [place]            Social Drivers of Health (SDOH) Interventions SDOH Screenings   Food Insecurity: No Food Insecurity (07/29/2024)  Housing: Low Risk  (07/29/2024)  Transportation Needs: No Transportation Needs (07/29/2024)  Utilities: Not At Risk (07/29/2024)  Depression (PHQ2-9): Low Risk  (03/25/2019)  Financial Resource Strain: Low Risk  (06/16/2018)  Physical Activity: Inactive (06/16/2018)  Social Connections: Moderately Integrated (07/29/2024)  Stress: No Stress Concern Present (06/16/2018)  Tobacco Use: Low Risk  (07/29/2024)     Readmission Risk Interventions     No data to display

## 2024-07-30 NOTE — Care Management Obs Status (Signed)
 MEDICARE OBSERVATION STATUS NOTIFICATION   Patient Details  Name: Evan Price MRN: [id number] Date of Birth: Aug 24, 1946   Medicare Observation Status Notification Given:  [name], LCSW 07/30/2024, 12:04 PM

## 2024-07-30 NOTE — Progress Notes (Signed)
   Subjective: 1 Day Post-Op Procedure(s) (LRB): ARTHROPLASTY, HIP, TOTAL, ANTERIOR APPROACH (Left) Patient reports pain as moderate.   Patient seen in rounds by Dr. Melodi. Patient has no issues other than pain in the left hip. Denies chest pain/SOB.  We will continue therapy today  Objective: Vital signs in last 24 hours: Temp:  [97.4 F (36.3 C)-98.5 F (36.9 C)] 98 F (36.7 C) (11/06 0542) Pulse Rate:  [71-97] 97 (11/06 0542) Resp:  [15-20] 16 (11/06 0542) BP: (100-160)/(62-116) 100/62 (11/06 0542) SpO2:  [94 %-100 %] 99 % (11/06 0542) Weight:  [73 kg] 73 kg (11/05 1114)  Intake/Output from previous day:  Intake/Output Summary (Last 24 hours) at 07/30/2024 0744 Last data filed at 07/30/2024 0600 Gross per 24 hour  Intake 2560 ml  Output 350 ml  Net 2210 ml     Intake/Output this shift: No intake/output data recorded.  Labs: Recent Labs    07/30/24 0332  HGB 10.9*   Recent Labs    07/30/24 0332  WBC 16.9*  RBC 3.86*  HCT 35.7*  PLT 233   Recent Labs    07/29/24 1454 07/30/24 0332  NA 139 138  K 5.0 5.0  CL 105 104  CO2 25 26  BUN 11 15  CREATININE 1.20 1.21  GLUCOSE 117* 151*  CALCIUM 9.2 9.1   No results for input(s): LABPT, INR in the last 72 hours.  Exam: General - Patient is Alert and Oriented Extremity - Neurologically intact Neurovascular intact Sensation intact distally Dorsiflexion/Plantar flexion intact Dressing - dressing C/D/I Motor Function - intact, moving foot and toes well on exam.   Past Medical History:  Diagnosis Date   Arthritis    Asthma    Chronic back pain 2015   Dyslipidemia    GERD (gastroesophageal reflux disease)    Pneumonia     Assessment/Plan: 1 Day Post-Op Procedure(s) (LRB): ARTHROPLASTY, HIP, TOTAL, ANTERIOR APPROACH (Left) Principal Problem:   OA (osteoarthritis) of hip Active Problems:   Primary osteoarthritis of right hip  Estimated body mass index is 22.45 kg/m as calculated from the  following:   Height as of this encounter: 5' 11 (1.803 m).   Weight as of this encounter: 73 kg. Advance diet Up with therapy D/C IV fluids  DVT Prophylaxis - Aspirin Weight bearing as tolerated. Continue therapy.  Plan is to go Home after hospital stay. Required IV pain meds last night, will see how he does with pain control today. On chronic Norco, so it was expected that pain management may be an issue in the early postoperative period.    If does well with PT and pain is controlled with PO meds, can discharge to home today with HEP. Follow-up in the office in 2 weeks.  The PDMP database was reviewed today prior to any opioid medications being prescribed to this patient.  Roxie Mess, PA-C Orthopedic Surgery [phone] 07/30/2024, 7:44 AM

## 2024-07-31 ENCOUNTER — Other Ambulatory Visit (HOSPITAL_COMMUNITY): Payer: Self-pay

## 2024-07-31 DIAGNOSIS — M1612 Unilateral primary osteoarthritis, left hip: Secondary | ICD-10-CM | POA: Diagnosis not present

## 2024-07-31 LAB — CBC
HCT: 30 % — ABNORMAL LOW (ref 39.0–52.0)
Hemoglobin: 9 g/dL — ABNORMAL LOW (ref 13.0–17.0)
MCH: 27.4 pg (ref 26.0–34.0)
MCHC: 30 g/dL (ref 30.0–36.0)
MCV: 91.5 fL (ref 80.0–100.0)
Platelets: 192 K/uL (ref 150–400)
RBC: 3.28 MIL/uL — ABNORMAL LOW (ref 4.22–5.81)
RDW: 15.3 % (ref 11.5–15.5)
WBC: 12.6 K/uL — ABNORMAL HIGH (ref 4.0–10.5)
nRBC: 0 % (ref 0.0–0.2)

## 2024-07-31 MED ORDER — METHOCARBAMOL 500 MG PO TABS: 500.0000 mg | ORAL_TABLET | Freq: Four times a day (QID) | ORAL | 0 refills | Status: AC | PRN

## 2024-07-31 MED ORDER — ASPIRIN 81 MG PO CHEW: 81.0000 mg | CHEWABLE_TABLET | Freq: Two times a day (BID) | ORAL | 0 refills | Status: AC

## 2024-07-31 MED ORDER — ONDANSETRON HCL 4 MG PO TABS: 4.0000 mg | ORAL_TABLET | Freq: Four times a day (QID) | ORAL | 0 refills | Status: AC | PRN

## 2024-07-31 MED ORDER — OXYCODONE HCL 5 MG PO TABS: 5.0000 mg | ORAL_TABLET | ORAL | 0 refills | Status: AC | PRN

## 2024-07-31 NOTE — Plan of Care (Signed)

## 2024-07-31 NOTE — Progress Notes (Signed)
 Provided discharge education/instructions, all questions/concerns addressed. Pt is not in any distress, discharged home with all of his belongings accompanied by his son.

## 2024-07-31 NOTE — Anesthesia Postprocedure Evaluation (Signed)
 Anesthesia Post Note  Patient: Evan Price  Procedure(s) Performed: ARTHROPLASTY, HIP, TOTAL, ANTERIOR APPROACH (Left: Hip)     Patient location during evaluation: PACU Anesthesia Type: General Level of consciousness: awake Pain management: pain level controlled Vital Signs Assessment: post-procedure vital signs reviewed and stable Respiratory status: spontaneous breathing Cardiovascular status: blood pressure returned to baseline Postop Assessment: no apparent nausea or vomiting Anesthetic complications: no   No notable events documented.                Lauraine KATHEE [place]

## 2024-07-31 NOTE — Progress Notes (Signed)
   Subjective: 2 Days Post-Op Procedure(s) (LRB): ARTHROPLASTY, HIP, TOTAL, ANTERIOR APPROACH (Left) Patient reports pain as mild.   Patient seen in rounds for Dr. Melodi. Patient is doing well this morning, pain is improved.  Plan is to go Home after hospital stay.  Objective: Vital signs in last 24 hours: Temp:  [97.8 F (36.6 C)-98.6 F (37 C)] 97.8 F (36.6 C) (11/07 0539) Pulse Rate:  [74-105] 74 (11/07 0539) Resp:  [18] 18 (11/07 0539) BP: (111-159)/(52-83) 129/61 (11/07 0539) SpO2:  [97 %-99 %] 97 % (11/07 0539)  Intake/Output from previous day:  Intake/Output Summary (Last 24 hours) at 07/31/2024 0805 Last data filed at 07/31/2024 0600 Gross per 24 hour  Intake 770 ml  Output 240 ml  Net 530 ml    Intake/Output this shift: No intake/output data recorded.  Labs: Recent Labs    07/30/24 0332 07/31/24 0323  HGB 10.9* 9.0*   Recent Labs    07/30/24 0332 07/31/24 0323  WBC 16.9* 12.6*  RBC 3.86* 3.28*  HCT 35.7* 30.0*  PLT 233 192   Recent Labs    07/29/24 1454 07/30/24 0332  NA 139 138  K 5.0 5.0  CL 105 104  CO2 25 26  BUN 11 15  CREATININE 1.20 1.21  GLUCOSE 117* 151*  CALCIUM 9.2 9.1   No results for input(s): LABPT, INR in the last 72 hours.  Exam: General - Patient is Alert and Oriented Extremity - Neurologically intact Neurovascular intact Sensation intact distally Dorsiflexion/Plantar flexion intact Dressing/Incision - clean, dry, no drainage Motor Function - intact, moving foot and toes well on exam.   Past Medical History:  Diagnosis Date   Arthritis    Asthma    Chronic back pain 2015   Dyslipidemia    GERD (gastroesophageal reflux disease)    Pneumonia     Assessment/Plan: 2 Days Post-Op Procedure(s) (LRB): ARTHROPLASTY, HIP, TOTAL, ANTERIOR APPROACH (Left) Principal Problem:   OA (osteoarthritis) of hip Active Problems:   Primary osteoarthritis of right hip  Estimated body mass index is 22.45 kg/m as  calculated from the following:   Height as of this encounter: 5' 11 (1.803 m).   Weight as of this encounter: 73 kg. Up with therapy  DVT Prophylaxis - Aspirin Weight-bearing as tolerated  Did not meet goals with therapy yesterday. Pain improved this morning, should be able to discharge today with HEP.  Roxie Mess, PA-C Orthopedic Surgery [phone] 07/31/2024, 8:05 AM

## 2024-07-31 NOTE — Progress Notes (Signed)
 Physical Therapy Treatment Patient Details Name: Evan Price MRN: [id number] DOB: 1946/04/22 Today's Date: 07/31/2024   History of Present Illness 78 yo male presents to therapy s/p L THA, anterior approach on 07/29/2024 due to failure of conservative measures. Pt PMH includes but is not limited to: anemia, CKD II, hypotension, asthma, HLD, chronic back pain, and GERD.    PT Comments  POD #2 Pt AxO x 3 pleasant and motivated.  Son present during session. Pt did well getting self OOB using his strap, tolerated an increased/functional amb distance.  Pain managable at 5/10 pre medicated.  Then returned to room to perform some TE's following HEP handout.  Instructed on proper tech, freq as well as use of ICE.   Addressed all mobility questions, discussed appropriate activity, educated on use of ICE.  Pt ready for D/C to home.    If plan is discharge home, recommend the following: A little help with walking and/or transfers;A little help with bathing/dressing/bathroom;Assistance with cooking/housework;Assist for transportation   Can travel by private vehicle        Equipment Recommendations  Rolling walker (2 wheels)    Recommendations for Other Services       Precautions / Restrictions Precautions Precautions: Fall Restrictions Weight Bearing Restrictions Per Provider Order: No LLE Weight Bearing Per Provider Order: Weight bearing as tolerated     Mobility  Bed Mobility Overal bed mobility: Needs Assistance Bed Mobility: Supine to Sit     Supine to sit: Used rails, HOB elevated, Supervision     General bed mobility comments: demonstarted and Educated how to use a belt to self assist LE OOB    Transfers Overall transfer level: Needs assistance Equipment used: Rolling walker (2 wheels) Transfers: Sit to/from Stand Sit to Stand: From elevated surface, Contact guard assist, Supervision           General transfer comment: < 25% VCs hand placement and safety with turns  using walker.    Ambulation/Gait Ambulation/Gait assistance: Supervision, Contact guard assist Gait Distance (Feet): 55 Feet Assistive device: Rolling walker (2 wheels) Gait Pattern/deviations: Step-to pattern, Decreased step length - right, Decreased step length - left Gait velocity: decreased     General Gait Details: tolerated an increased, functional distance with <25% VC's on proper walker to self distance and safety with turns.  Pt reports L hip pain as 5/10 (pre medicated).   Stairs      NO stairs       Wheelchair Mobility     Tilt Bed    Modified Rankin (Stroke Patients Only)       Balance                                            Communication Communication Communication: No apparent difficulties  Cognition Arousal: Alert Behavior During Therapy: WFL for tasks assessed/performed   PT - Cognitive impairments: No apparent impairments                       PT - Cognition Comments: AxO x 3 pleasant Following commands: Intact      Cueing Cueing Techniques: Verbal cues  Exercises  Total Hip Replacement TE's following HEP Handout 10 reps ankle pumps 05 reps knee presses 05 reps heel slides 05 reps SAQ's 05 reps ABD Instructed how to use a belt loop to assist  Followed by ICE  General Comments        Pertinent Vitals/Pain Pain Assessment Pain Assessment: 0-10 Pain Score: 5  Pain Location: L  hip Pain Descriptors / Indicators: Aching, Constant, Discomfort, Dull, Grimacing, Operative site guarding Pain Intervention(s): Monitored during session, Premedicated before session, Repositioned, Ice applied    Home Living                          Prior Function            PT Goals (current goals can now be found in the care plan section) Progress towards PT goals: Progressing toward goals    Frequency    7X/week      PT Plan      Co-evaluation              AM-PAC PT 6 Clicks Mobility    Outcome Measure  Help needed turning from your back to your side while in a flat bed without using bedrails?: None Help needed moving from lying on your back to sitting on the side of a flat bed without using bedrails?: None Help needed moving to and from a bed to a chair (including a wheelchair)?: None Help needed standing up from a chair using your arms (e.g., wheelchair or bedside chair)?: None Help needed to walk in hospital room?: None Help needed climbing 3-5 steps with a railing? : None 6 Click Score: 24    End of Session Equipment Utilized During Treatment: Gait belt Activity Tolerance: Patient tolerated treatment well Patient left: in chair;with call bell/phone within reach;with family/visitor present Nurse Communication: Mobility status PT Visit Diagnosis: Unsteadiness on feet (R26.81);Other abnormalities of gait and mobility (R26.89);Muscle weakness (generalized) (M62.81);Difficulty in walking, not elsewhere classified (R26.2);Pain Pain - Right/Left: Left Pain - part of body: Hip     Time: 8961-8879 PT Time Calculation (min) (ACUTE ONLY): 42 min  Charges:    $Gait Training: 8-22 mins $Therapeutic Exercise: 8-22 mins $Therapeutic Activity: 8-22 mins PT General Charges $$ ACUTE PT VISIT: 1 Visit                    Katheryn Leap  PTA Acute  Rehabilitation Services Office M-F          [phone]

## 2024-07-31 NOTE — Plan of Care (Signed)

## 2024-08-03 NOTE — Discharge Summary (Signed)
 Patient ID: Evan Price MRN: [id number] DOB/AGE: Sep 22, 1946 78 y.o.  Admit date: 07/29/2024 Discharge date: 07/31/2024  Admission Diagnoses:  Principal Problem:   OA (osteoarthritis) of hip Active Problems:   Primary osteoarthritis of right hip   Discharge Diagnoses:  Same  Past Medical History:  Diagnosis Date   Arthritis    Asthma    Chronic back pain 2015   Dyslipidemia    GERD (gastroesophageal reflux disease)    Pneumonia     Surgeries: Procedure(s): ARTHROPLASTY, HIP, TOTAL, ANTERIOR APPROACH on 07/29/2024   Consultants:   Discharged Condition: Improved  Hospital Course: Mikale Silversmith is an 78 y.o. male who was admitted 07/29/2024 for operative treatment ofOA (osteoarthritis) of hip. Patient has severe unremitting pain that affects sleep, daily activities, and work/hobbies. After pre-op clearance the patient was taken to the operating room on 07/29/2024 and underwent  Procedure(s): ARTHROPLASTY, HIP, TOTAL, ANTERIOR APPROACH.    Patient was given perioperative antibiotics:  Anti-infectives (From admission, onward)    Start     Dose/Rate Route Frequency Ordered Stop   07/29/24 2200  ceFAZolin (ANCEF) IVPB 2g/100 mL premix        2 g 200 mL/hr over 30 Minutes Intravenous Every 6 hours 07/29/24 1643 07/30/24 0409   07/29/24 1200  levofloxacin  (LEVAQUIN ) IVPB 500 mg  Status:  Discontinued        500 mg 100 mL/hr over 60 Minutes Intravenous On call to O.R. 07/29/24 1044 07/29/24 1623   07/29/24 1200  vancomycin  (VANCOCIN ) IVPB 1000 mg/200 mL premix        1,000 mg 200 mL/hr over 60 Minutes Intravenous On call to O.R. 07/29/24 1044 07/29/24 1245        Patient was given sequential compression devices, early ambulation, and chemoprophylaxis to prevent DVT.  Patient benefited maximally from hospital stay and there were no complications.    Recent vital signs: No data found.   Recent laboratory studies: No results for input(s): WBC, HGB, HCT, PLT, NA,  K, CL, CO2, BUN, CREATININE, GLUCOSE, INR, CALCIUM in the last 72 hours.  Invalid input(s): PT, 2   Discharge Medications:   Allergies as of 07/31/2024       Reactions   Dust Mite Extract Other (See Comments)   Unknown    Keflex [cephalexin] Other (See Comments)   Unknown         Medication List     TAKE these medications    albuterol  (2.5 MG/3ML) 0.083% nebulizer solution Commonly known as: PROVENTIL  Take 2.5 mg by nebulization every 6 (six) hours as needed for wheezing or shortness of breath. Notes to patient: Last dose taken: 11/06 at 08:45pm   Ventolin  HFA 108 (90 Base) MCG/ACT inhaler Generic drug: albuterol  Inhale 1-2 puffs into the lungs every 6 (six) hours as needed for wheezing or shortness of breath.   aspirin 81 MG chewable tablet Chew 1 tablet (81 mg total) by mouth 2 (two) times daily for 21 days. Then take one 81 mg aspirin once a day for three weeks. Then discontinue aspirin.   gabapentin  600 MG tablet Commonly known as: NEURONTIN  Take 600 mg by mouth 3 (three) times daily.   HYDROcodone -acetaminophen  10-325 MG tablet Commonly known as: NORCO Take 1 tablet by mouth in the morning and at bedtime. Notes to patient: Last dose taken: 11/07 at 06:41am   methocarbamol 500 MG tablet Commonly known as: ROBAXIN Take 1 tablet (500 mg total) by mouth every 6 (six) hours as needed for muscle spasms. Notes to  patient: Last dose taken: 11/07 at 05:35am   ondansetron  4 MG tablet Commonly known as: ZOFRAN  Take 1 tablet (4 mg total) by mouth every 6 (six) hours as needed for nausea. Notes to patient: Last dose taken: 11/05 at 01:29pm   oxyCODONE  5 MG immediate release tablet Commonly known as: Oxy IR/ROXICODONE  Take 1 tablet (5 mg total) by mouth every 4 (four) hours as needed for severe pain (pain score 7-10). Notes to patient: Last dose taken: 11/07 at 09:50am               Discharge Care Instructions  (From admission, onward)            Start     Ordered   07/30/24 0000  Weight bearing as tolerated        07/30/24 0747   07/30/24 0000  Change dressing       Comments: You have an adhesive waterproof bandage over the incision. Leave this in place until your first follow-up appointment. Once you remove this you will not need to place another bandage.   07/30/24 0747            Diagnostic Studies: DG Pelvis Portable Result Date: 07/29/2024 EXAM: 1 or 2 VIEW(S) XRAY OF THE PELVIS 07/29/2024 03:41:00 PM COMPARISON: None available. CLINICAL HISTORY: Status post hip replacement FINDINGS: BONES AND JOINTS: Left total hip arthroplasty in place. Expected postoperative changes. No acute fracture. No focal osseous lesion. No joint dislocation. SOFT TISSUES: Expected soft tissue changes. Pelvic phleboliths. IMPRESSION: 1. Left total hip arthroplasty in place with expected postoperative changes. Electronically signed by: Lonni Necessary MD 07/29/2024 06:52 PM EST RP Workstation: [id number]   DG HIP UNILAT WITH PELVIS 1V LEFT Result Date: 07/29/2024 EXAM: FLUOROSCOPIC IMAGES, 5 views. TECHNIQUE: Fluoroscopy was provided by the radiology department for procedure. Radiologist was not present during examination. FLUOROSCOPY DOSE AND TYPE: Radiation Dose Index: Reference Air Kerma (in mGy) = 0.57 mGy COMPARISON: None available. CLINICAL HISTORY: Intraprocedural imaging. FINDINGS: Intraoperative fluoroscopic imaging was performed. Left total hip arthroplasty is in place. No immediate complications are present. IMPRESSION: 1. Left total hip arthroplasty in place without immediate complications. NOTE: Intraoperative fluoroscopic spot images as above. Please refer to the intraoperative report for full details. Electronically signed by: Lonni Necessary MD 07/29/2024 06:52 PM EST RP Workstation: [id number]   DG C-Arm 1-60 Min-No Report Result Date: 07/29/2024 Fluoroscopy was utilized by the requesting physician.  No radiographic  interpretation.   DG C-Arm 1-60 Min-No Report Result Date: 07/29/2024 Fluoroscopy was utilized by the requesting physician.  No radiographic interpretation.    Disposition: Discharge disposition: 01-Home or Self Care       Discharge Instructions     Call MD / Call 911   Complete by: As directed    If you experience chest pain or shortness of breath, CALL 911 and be transported to the hospital emergency room.  If you develope a fever above 101 F, pus (white drainage) or increased drainage or redness at the wound, or calf pain, call your surgeon's office.   Change dressing   Complete by: As directed    You have an adhesive waterproof bandage over the incision. Leave this in place until your first follow-up appointment. Once you remove this you will not need to place another bandage.   Constipation Prevention   Complete by: As directed    Drink plenty of fluids.  Prune juice may be helpful.  You may use a stool softener, such as Colace (over the  counter) 100 mg twice a day.  Use MiraLax (over the counter) for constipation as needed.   Diet - low sodium heart healthy   Complete by: As directed    Do not sit on low chairs, stoools or toilet seats, as it may be difficult to get up from low surfaces   Complete by: As directed    Driving restrictions   Complete by: As directed    No driving for two weeks   Post-operative opioid taper instructions:   Complete by: As directed    POST-OPERATIVE OPIOID TAPER INSTRUCTIONS: It is important to wean off of your opioid medication as soon as possible. If you do not need pain medication after your surgery it is ok to stop day one. Opioids include: Codeine, Hydrocodone (Norco, Vicodin), Oxycodone (Percocet, oxycontin ) and hydromorphone  amongst others.  Long term and even short term use of opiods can cause: Increased pain response Dependence Constipation Depression Respiratory depression And more.  Withdrawal symptoms can include Flu like  symptoms Nausea, vomiting And more Techniques to manage these symptoms Hydrate well Eat regular healthy meals Stay active Use relaxation techniques(deep breathing, meditating, yoga) Do Not substitute Alcohol to help with tapering If you have been on opioids for less than two weeks and do not have pain than it is ok to stop all together.  Plan to wean off of opioids This plan should start within one week post op of your joint replacement. Maintain the same interval or time between taking each dose and first decrease the dose.  Cut the total daily intake of opioids by one tablet each day Next start to increase the time between doses. The last dose that should be eliminated is the evening dose.      TED hose   Complete by: As directed    Use stockings (TED hose) for three weeks on both leg(s).  You may remove them at night for sleeping.   Weight bearing as tolerated   Complete by: As directed         Follow-up Information     Aluisio, Dempsey, MD. Schedule an appointment as soon as possible for a visit in 2 week(s).   Specialty: Orthopedic Surgery Contact information: [address] [place] [address] [phone]                  Signed: Roxie Mess 08/03/2024, 11:25 AM
